# Patient Record
Sex: Male | Born: 1975 | Race: White | Hispanic: No | Marital: Single | Smoking: Never smoker
Health system: Southern US, Community
[De-identification: ages and names within clinical notes are randomized; demographics above are authoritative.]

## PROBLEM LIST (undated history)

## (undated) DIAGNOSIS — I10 Essential (primary) hypertension: Secondary | ICD-10-CM

## (undated) DIAGNOSIS — E7800 Pure hypercholesterolemia, unspecified: Secondary | ICD-10-CM

## (undated) DIAGNOSIS — F840 Autistic disorder: Secondary | ICD-10-CM

## (undated) DIAGNOSIS — G4730 Sleep apnea, unspecified: Secondary | ICD-10-CM

## (undated) HISTORY — PX: TONSILLECTOMY: SUR1361

---
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
## 2020-01-04 ENCOUNTER — Observation Stay (HOSPITAL_BASED_OUTPATIENT_CLINIC_OR_DEPARTMENT_OTHER)
Admission: EM | Admit: 2020-01-04 | Discharge: 2020-01-06 | Disposition: A | Discharge status: Home / Self Care | Payer: 59 | Attending: Family Medicine | Admitting: Family Medicine

## 2020-01-04 ENCOUNTER — Emergency Department (HOSPITAL_COMMUNITY): Payer: 59

## 2020-01-04 ENCOUNTER — Encounter (HOSPITAL_BASED_OUTPATIENT_CLINIC_OR_DEPARTMENT_OTHER): Payer: Self-pay | Admitting: Emergency Medicine

## 2020-01-04 ENCOUNTER — Other Ambulatory Visit: Payer: Self-pay

## 2020-01-04 ENCOUNTER — Emergency Department (HOSPITAL_BASED_OUTPATIENT_CLINIC_OR_DEPARTMENT_OTHER): Payer: 59

## 2020-01-04 ENCOUNTER — Inpatient Hospital Stay (HOSPITAL_COMMUNITY): Payer: 59

## 2020-01-04 DIAGNOSIS — G4733 Obstructive sleep apnea (adult) (pediatric): Secondary | ICD-10-CM | POA: Diagnosis not present

## 2020-01-04 DIAGNOSIS — I161 Hypertensive emergency: Secondary | ICD-10-CM | POA: Diagnosis not present

## 2020-01-04 DIAGNOSIS — Z6835 Body mass index (BMI) 35.0-35.9, adult: Secondary | ICD-10-CM | POA: Diagnosis not present

## 2020-01-04 DIAGNOSIS — Z20822 Contact with and (suspected) exposure to covid-19: Secondary | ICD-10-CM | POA: Insufficient documentation

## 2020-01-04 DIAGNOSIS — H8120 Vestibular neuronitis, unspecified ear: Secondary | ICD-10-CM | POA: Insufficient documentation

## 2020-01-04 DIAGNOSIS — H5509 Other forms of nystagmus: Secondary | ICD-10-CM | POA: Insufficient documentation

## 2020-01-04 DIAGNOSIS — I639 Cerebral infarction, unspecified: Secondary | ICD-10-CM

## 2020-01-04 DIAGNOSIS — R42 Dizziness and giddiness: Secondary | ICD-10-CM | POA: Diagnosis present

## 2020-01-04 DIAGNOSIS — E7800 Pure hypercholesterolemia, unspecified: Secondary | ICD-10-CM | POA: Diagnosis not present

## 2020-01-04 DIAGNOSIS — D329 Benign neoplasm of meninges, unspecified: Secondary | ICD-10-CM | POA: Diagnosis not present

## 2020-01-04 DIAGNOSIS — H8309 Labyrinthitis, unspecified ear: Secondary | ICD-10-CM | POA: Insufficient documentation

## 2020-01-04 DIAGNOSIS — I10 Essential (primary) hypertension: Secondary | ICD-10-CM | POA: Diagnosis not present

## 2020-01-04 DIAGNOSIS — E785 Hyperlipidemia, unspecified: Secondary | ICD-10-CM | POA: Insufficient documentation

## 2020-01-04 DIAGNOSIS — F840 Autistic disorder: Secondary | ICD-10-CM | POA: Diagnosis not present

## 2020-01-04 DIAGNOSIS — I119 Hypertensive heart disease without heart failure: Secondary | ICD-10-CM | POA: Diagnosis not present

## 2020-01-04 DIAGNOSIS — Z9989 Dependence on other enabling machines and devices: Secondary | ICD-10-CM

## 2020-01-04 DIAGNOSIS — D320 Benign neoplasm of cerebral meninges: Secondary | ICD-10-CM | POA: Diagnosis not present

## 2020-01-04 HISTORY — DX: Pure hypercholesterolemia, unspecified: E78.00

## 2020-01-04 HISTORY — DX: Sleep apnea, unspecified: G47.30

## 2020-01-04 HISTORY — DX: Autistic disorder: F84.0

## 2020-01-04 LAB — CBC
HCT: 50.2 % (ref 39.0–52.0)
Hemoglobin: 17.9 g/dL — ABNORMAL HIGH (ref 13.0–17.0)
MCH: 30.4 pg (ref 26.0–34.0)
MCHC: 35.7 g/dL (ref 30.0–36.0)
MCV: 85.2 fL (ref 80.0–100.0)
Platelets: 232 10*3/uL (ref 150–400)
RBC: 5.89 MIL/uL — ABNORMAL HIGH (ref 4.22–5.81)
RDW: 12.4 % (ref 11.5–15.5)
WBC: 6.1 10*3/uL (ref 4.0–10.5)
nRBC: 0 % (ref 0.0–0.2)

## 2020-01-04 LAB — APTT: aPTT: 35 seconds (ref 24–36)

## 2020-01-04 LAB — COMPREHENSIVE METABOLIC PANEL
ALT: 46 U/L — ABNORMAL HIGH (ref 0–44)
AST: 32 U/L (ref 15–41)
Albumin: 4.4 g/dL (ref 3.5–5.0)
Alkaline Phosphatase: 67 U/L (ref 38–126)
Anion gap: 12 (ref 5–15)
BUN: 17 mg/dL (ref 6–20)
CO2: 25 mmol/L (ref 22–32)
Calcium: 9.5 mg/dL (ref 8.9–10.3)
Chloride: 102 mmol/L (ref 98–111)
Creatinine, Ser: 1.13 mg/dL (ref 0.61–1.24)
GFR calc Af Amer: >60 mL/min (ref >60)
GFR calc non Af Amer: >60 mL/min (ref >60)
Glucose, Bld: 146 mg/dL — ABNORMAL HIGH (ref 70–99)
Potassium: 4 mmol/L (ref 3.5–5.1)
Sodium: 139 mmol/L (ref 135–145)
Total Bilirubin: 1.2 mg/dL (ref 0.3–1.2)
Total Protein: 7.4 g/dL (ref 6.5–8.1)

## 2020-01-04 LAB — RAPID URINE DRUG SCREEN, HOSP PERFORMED
Amphetamines: NOT DETECTED
Barbiturates: NOT DETECTED
Benzodiazepines: NOT DETECTED
Cocaine: NOT DETECTED
Opiates: NOT DETECTED
Tetrahydrocannabinol: NOT DETECTED

## 2020-01-04 LAB — DIFFERENTIAL
Abs Immature Granulocytes: 0.02 10*3/uL (ref 0.00–0.07)
Basophils Absolute: 0 10*3/uL (ref 0.0–0.1)
Basophils Relative: 1 %
Eosinophils Absolute: 0.1 10*3/uL (ref 0.0–0.5)
Eosinophils Relative: 2 %
Immature Granulocytes: 0 %
Lymphocytes Relative: 32 %
Lymphs Abs: 1.9 10*3/uL (ref 0.7–4.0)
Monocytes Absolute: 0.3 10*3/uL (ref 0.1–1.0)
Monocytes Relative: 6 %
Neutro Abs: 3.6 10*3/uL (ref 1.7–7.7)
Neutrophils Relative %: 59 %

## 2020-01-04 LAB — URINALYSIS, ROUTINE W REFLEX MICROSCOPIC
Bilirubin Urine: NEGATIVE
Glucose, UA: NEGATIVE mg/dL
Hgb urine dipstick: NEGATIVE
Ketones, ur: NEGATIVE mg/dL
Leukocytes,Ua: NEGATIVE
Nitrite: NEGATIVE
Protein, ur: NEGATIVE mg/dL
Specific Gravity, Urine: 1.015 (ref 1.005–1.030)
pH: 7 (ref 5.0–8.0)

## 2020-01-04 LAB — ETHANOL: Alcohol, Ethyl (B): <10 mg/dL (ref <10)

## 2020-01-04 LAB — SARS CORONAVIRUS 2 BY RT PCR (HOSPITAL ORDER, PERFORMED IN ~~LOC~~ HOSPITAL LAB): SARS Coronavirus 2: NEGATIVE

## 2020-01-04 LAB — PROTIME-INR
INR: 1.1 (ref 0.8–1.2)
Prothrombin Time: 14.1 seconds (ref 11.4–15.2)

## 2020-01-04 LAB — GLUCOSE, CAPILLARY: Glucose-Capillary: 116 mg/dL — ABNORMAL HIGH (ref 70–99)

## 2020-01-04 LAB — CBG MONITORING, ED: Glucose-Capillary: 131 mg/dL — ABNORMAL HIGH (ref 70–99)

## 2020-01-04 MED ORDER — PROCHLORPERAZINE EDISYLATE 10 MG/2ML IJ SOLN
10.0000 mg | Freq: Once | INTRAMUSCULAR | Status: AC
  Administered 2020-01-04: 10 mg via INTRAVENOUS
  Filled 2020-01-04: qty 2

## 2020-01-04 MED ORDER — DIPHENHYDRAMINE HCL 50 MG/ML IJ SOLN
25.0000 mg | Freq: Once | INTRAMUSCULAR | Status: AC
  Administered 2020-01-04: 25 mg via INTRAVENOUS
  Filled 2020-01-04: qty 1

## 2020-01-04 MED ORDER — ASPIRIN 325 MG PO TABS
325.0000 mg | ORAL_TABLET | Freq: Every day | ORAL | Status: DC
  Administered 2020-01-04 – 2020-01-05 (×2): 325 mg via ORAL
  Filled 2020-01-04 (×2): qty 1

## 2020-01-04 MED ORDER — CLOPIDOGREL BISULFATE 75 MG PO TABS
75.0000 mg | ORAL_TABLET | Freq: Every day | ORAL | Status: DC
  Administered 2020-01-04 – 2020-01-06 (×3): 75 mg via ORAL
  Filled 2020-01-04 (×3): qty 1

## 2020-01-04 MED ORDER — ACETAMINOPHEN 325 MG PO TABS
650.0000 mg | ORAL_TABLET | ORAL | Status: DC | PRN
  Administered 2020-01-05 – 2020-01-06 (×4): 650 mg via ORAL
  Filled 2020-01-04 (×4): qty 2

## 2020-01-04 MED ORDER — IOHEXOL 350 MG/ML SOLN
100.0000 mL | Freq: Once | INTRAVENOUS | Status: AC | PRN
  Administered 2020-01-04: 100 mL via INTRAVENOUS

## 2020-01-04 MED ORDER — ACETAMINOPHEN 160 MG/5ML PO SOLN: 650.0000 mg | ORAL | Status: DC | PRN

## 2020-01-04 MED ORDER — NITROGLYCERIN IN D5W 200-5 MCG/ML-% IV SOLN
0.0000 ug/min | INTRAVENOUS | Status: DC
  Administered 2020-01-04: 20 ug/min via INTRAVENOUS
  Filled 2020-01-04: qty 250

## 2020-01-04 MED ORDER — CHLORHEXIDINE GLUCONATE CLOTH 2 % EX PADS: 6.0000 | MEDICATED_PAD | Freq: Every day | CUTANEOUS | Status: DC

## 2020-01-04 MED ORDER — ENOXAPARIN SODIUM 40 MG/0.4ML ~~LOC~~ SOLN
40.0000 mg | SUBCUTANEOUS | Status: DC
  Administered 2020-01-04 – 2020-01-05 (×2): 40 mg via SUBCUTANEOUS
  Filled 2020-01-04 (×2): qty 0.4

## 2020-01-04 MED ORDER — LABETALOL HCL 5 MG/ML IV SOLN
20.0000 mg | Freq: Once | INTRAVENOUS | Status: AC
  Administered 2020-01-04: 20 mg via INTRAVENOUS
  Filled 2020-01-04: qty 4

## 2020-01-04 MED ORDER — ACETAMINOPHEN 650 MG RE SUPP: 650.0000 mg | RECTAL | Status: DC | PRN

## 2020-01-04 MED ORDER — NICARDIPINE HCL IN NACL 20-0.86 MG/200ML-% IV SOLN
3.0000 mg/h | INTRAVENOUS | Status: DC
  Filled 2020-01-04: qty 200

## 2020-01-04 MED ORDER — LABETALOL HCL 5 MG/ML IV SOLN
INTRAVENOUS | Status: AC
  Filled 2020-01-04: qty 4

## 2020-01-04 MED ORDER — SODIUM CHLORIDE 0.9 % IV SOLN
0.5000 mg/min | INTRAVENOUS | Status: DC
  Administered 2020-01-04: 0.5 mg/min via INTRAVENOUS
  Filled 2020-01-04 (×2): qty 100

## 2020-01-04 MED ORDER — STROKE: EARLY STAGES OF RECOVERY BOOK
Freq: Once | Status: AC
  Filled 2020-01-04: qty 1

## 2020-01-04 MED ORDER — LABETALOL HCL 5 MG/ML IV SOLN
10.0000 mg | Freq: Once | INTRAVENOUS | Status: AC
  Administered 2020-01-04: 10 mg via INTRAVENOUS
  Filled 2020-01-04: qty 4

## 2020-01-04 NOTE — ED Notes (Signed)
Patient transported to MRI 

## 2020-01-04 NOTE — Discharge Instructions (Signed)
Go now to the [facility] ED.

## 2020-01-04 NOTE — ED Notes (Signed)
ED Provider at bedside. 

## 2020-01-04 NOTE — Progress Notes (Signed)
[place] Progress Note Patient Name: Anthony Haas DOB: 02-22-1976 MRN: [id number]   Date of Service  01/04/2020  HPI/Events of Note  FP resident Dr. Zettie Cooley called requesting permission to admit patient with hypertensive urgency to the ICU on the [facility] service due to the potential need for an infusion to control patient's blood pressure, Pt's elevated blood pressure appears to be principally due to non-compliance with his anti-hypertensive medications, a small meningioma on MRI of his brain today was an incidental finding.  eICU Interventions  Dr. Maudie Mercury informed that it was okay to admit patient to the ICU under [place] service.        Kerry Kass Tipton Ballow 01/04/2020, 9:01 PM

## 2020-01-04 NOTE — ED Notes (Signed)
Patient transported to CT with nurse on monitor ?

## 2020-01-04 NOTE — ED Provider Notes (Signed)
[facility] EMERGENCY DEPARTMENT Provider Note   CSN: [id number] Arrival date & time: 01/04/20  [id number]     History Chief Complaint  Patient presents with   Dizziness    Anthony Haas is a 44 y.o. male.  44 yo M with a chief complaints of dizziness.  Patient states he feels like the world was moving slowly to the right.  This happened acutely about 4 hours ago.  No headache no neck pain.  No head trauma.  He was at work and was looking at his phone.  Works the night shift.  At 1 point felt very sweaty.  He got up to walk to the bathroom and he ended up bumping against the wall as he felt like everything was turning to the right.  He ended up calling some coworkers who called the local EMS.  At that point the patient symptoms had resolved and he was able to get up and walk to his car.  Just before he got to his car his symptoms had reoccurred.  Continue to feel like things are moving to the right.  He sat in his car for about 20 minutes or so and then realized things were not improving and so called his father to take him here.  Since then his symptoms have again significantly improved.  He feels that things are very slowly moving to the right but are otherwise better.  He has chronic right-sided tinnitus which is unchanged.  Chronic sinus congestion.  Denies difficulty with one-sided numbness or weakness denies difficulty with speech or swallowing.  He does have a history of hypertension and is not on medication for it.  The history is provided by the patient and a parent.  Dizziness Quality:  Imbalance Severity:  Moderate Onset quality:  Gradual Duration:  4 hours Timing:  Constant Progression:  Unchanged Chronicity:  New Relieved by:  Nothing Worsened by:  Nothing Ineffective treatments:  None tried Associated symptoms: no chest pain, no diarrhea, no headaches, no palpitations, no shortness of breath and no vomiting        Past Medical History:  Diagnosis Date   Autism      High cholesterol    Hypertension    Sleep apnea     There are no problems to display for this patient.   Past Surgical History:  Procedure Laterality Date   TONSILLECTOMY         No family history on file.  Social History   Tobacco Use   Smoking status: Not on file  Substance Use Topics   Alcohol use: Not Currently   Drug use: Not on file    Home Medications Prior to Admission medications   Not on File    Allergies    Patient has no known allergies.  Review of Systems   Review of Systems  Constitutional: Negative for chills and fever.  HENT: Negative for congestion and facial swelling.   Eyes: Negative for discharge and visual disturbance.  Respiratory: Negative for shortness of breath.   Cardiovascular: Negative for chest pain and palpitations.  Gastrointestinal: Negative for abdominal pain, diarrhea and vomiting.  Musculoskeletal: Negative for arthralgias and myalgias.  Skin: Negative for color change and rash.  Neurological: Positive for dizziness. Negative for tremors, syncope and headaches.  Psychiatric/Behavioral: Negative for confusion and dysphoric mood.    Physical Exam Updated Vital Signs BP (!) 216/134    Pulse 84    Temp 98 F (36.7 C) (Oral)    Resp  20    Ht 5\' 6"  (1.676 m)    Wt 110.2 kg    SpO2 99%    BMI 39.21 kg/m   Physical Exam Vitals and nursing note reviewed.  Constitutional:      Appearance: He is well-developed.  HENT:     Head: Normocephalic and atraumatic.  Eyes:     Pupils: Pupils are equal, round, and reactive to light.     Comments: Leftward fastgoing nystagmus non fatigable.   Neck:     Vascular: No JVD.  Cardiovascular:     Rate and Rhythm: Normal rate and regular rhythm.     Heart sounds: No murmur. No friction rub. No gallop.   Pulmonary:     Effort: No respiratory distress.     Breath sounds: No wheezing.  Abdominal:     General: There is no distension.     Tenderness: There is no guarding or rebound.   Musculoskeletal:        General: Normal range of motion.     Cervical back: Normal range of motion and neck supple.  Skin:    Coloration: Skin is not pale.     Findings: No rash.  Neurological:     Mental Status: He is alert and oriented to person, place, and time.     GCS: GCS eye subscore is 4. GCS verbal subscore is 5. GCS motor subscore is 6.     Cranial Nerves: Cranial nerves are intact.     Sensory: Sensation is intact.     Motor: Motor function is intact.     Coordination: Coordination is intact.     Comments: Patient is able to ambulate but awkwardly bumps against the door before he returns back to the bed.  Otherwise benign exam.  Negative Dix-Hallpike bilaterally  Psychiatric:        Behavior: Behavior normal.     ED Results / Procedures / Treatments   Labs (all labs ordered are listed, but only abnormal results are displayed) Labs Reviewed  CBC - Abnormal; Notable for the following components:      Result Value   RBC 5.89 (*)    Hemoglobin 17.9 (*)    All other components within normal limits  COMPREHENSIVE METABOLIC PANEL - Abnormal; Notable for the following components:   Glucose, Bld 146 (*)    ALT 46 (*)    All other components within normal limits  CBG MONITORING, ED - Abnormal; Notable for the following components:   Glucose-Capillary 131 (*)    All other components within normal limits  PROTIME-INR  APTT  DIFFERENTIAL  ETHANOL  RAPID URINE DRUG SCREEN, HOSP PERFORMED  URINALYSIS, ROUTINE W REFLEX MICROSCOPIC    EKG EKG Interpretation  Date/Time:  Tuesday Jan 04 2020 09:49:45 EDT Ventricular Rate:  80 PR Interval:    QRS Duration: 96 QT Interval:  369 QTC Calculation: 426 R Axis:   59 Text Interpretation: Sinus rhythm LAE, consider biatrial enlargement Repol abnrm suggests ischemia, inferior leads No old tracing to compare Confirmed by Deno Etienne [phone]) on 01/04/2020 10:04:42 AM   Radiology CT Code Stroke CTA Head W/WO contrast  Result  Date: 01/04/2020 CLINICAL DATA:  Dizziness and nausea EXAM: CT ANGIOGRAPHY HEAD AND NECK TECHNIQUE: Multidetector CT imaging of the head and neck was performed using the standard protocol during bolus administration of intravenous contrast. Multiplanar CT image reconstructions and MIPs were obtained to evaluate the vascular anatomy. Carotid stenosis measurements (when applicable) are obtained utilizing NASCET criteria, using the  distal internal carotid diameter as the denominator. CONTRAST:  119mL OMNIPAQUE IOHEXOL 350 MG/ML SOLN COMPARISON:  None. FINDINGS: CT HEAD FINDINGS Brain: Ventricle size normal. Negative for acute infarct, hemorrhage, mass. Vascular: Vessels of the base of brain are diffusely increased density however there is no asymmetry evidence of acute abnormality. This finding may be due to elevated hemoglobin. Skull: Negative. 12 mm dermal cyst in the left occipital region most likely a Pilar cyst. Sinuses: Mild mucosal edema right ethmoid sinus otherwise clear. Orbits: Negative Review of the MIP images confirms the above findings CTA NECK FINDINGS Aortic arch: 4 vessel arch. Left vertebral origin from the arch. Imaged portion shows no evidence of aneurysm or dissection. No significant stenosis of the major arch vessel origins. Right carotid system: Normal right carotid without stenosis or dissection Left carotid system: Normal left carotid without stenosis or dissection Vertebral arteries: Both vertebral arteries widely patent. Left vertebral artery origin from the arch. Skeleton: No acute skeletal abnormality.  Dental caries. Other neck: Negative for mass or adenopathy in the neck. Upper chest: Lung apices clear bilaterally. Review of the MIP images confirms the above findings CTA HEAD FINDINGS Anterior circulation: Internal carotid artery widely patent bilaterally without stenosis. Anterior and middle cerebral arteries widely patent bilaterally. Posterior circulation: Both vertebral arteries  patent to the basilar. Right vertebral dominant. PICA patent bilaterally. Basilar tortuous and widely patent. Superior cerebellar and posterior cerebral arteries patent bilaterally. Fetal origin right posterior cerebral artery. Venous sinuses: Normal venous enhancement Anatomic variants: None Review of the MIP images confirms the above findings IMPRESSION: 1. No acute intracranial abnormality. 2. Negative for intracranial large vessel occlusion 3. No significant carotid or vertebral artery stenosis in the neck 4. No intracranial stenosis. 5. These results were called by telephone at the time of interpretation on 01/04/2020 at 10:34 am to provider Khyson Sebesta , who verbally acknowledged these results. Electronically Signed   By: Franchot Gallo M.D.   On: 01/04/2020 10:34   CT HEAD WO CONTRAST  Result Date: 01/04/2020 CLINICAL DATA:  Dizziness and nausea EXAM: CT ANGIOGRAPHY HEAD AND NECK TECHNIQUE: Multidetector CT imaging of the head and neck was performed using the standard protocol during bolus administration of intravenous contrast. Multiplanar CT image reconstructions and MIPs were obtained to evaluate the vascular anatomy. Carotid stenosis measurements (when applicable) are obtained utilizing NASCET criteria, using the distal internal carotid diameter as the denominator. CONTRAST:  138mL OMNIPAQUE IOHEXOL 350 MG/ML SOLN COMPARISON:  None. FINDINGS: CT HEAD FINDINGS Brain: Ventricle size normal. Negative for acute infarct, hemorrhage, mass. Vascular: Vessels of the base of brain are diffusely increased density however there is no asymmetry evidence of acute abnormality. This finding may be due to elevated hemoglobin. Skull: Negative. 12 mm dermal cyst in the left occipital region most likely a Pilar cyst. Sinuses: Mild mucosal edema right ethmoid sinus otherwise clear. Orbits: Negative Review of the MIP images confirms the above findings CTA NECK FINDINGS Aortic arch: 4 vessel arch. Left vertebral origin from  the arch. Imaged portion shows no evidence of aneurysm or dissection. No significant stenosis of the major arch vessel origins. Right carotid system: Normal right carotid without stenosis or dissection Left carotid system: Normal left carotid without stenosis or dissection Vertebral arteries: Both vertebral arteries widely patent. Left vertebral artery origin from the arch. Skeleton: No acute skeletal abnormality.  Dental caries. Other neck: Negative for mass or adenopathy in the neck. Upper chest: Lung apices clear bilaterally. Review of the MIP images confirms the above  findings CTA HEAD FINDINGS Anterior circulation: Internal carotid artery widely patent bilaterally without stenosis. Anterior and middle cerebral arteries widely patent bilaterally. Posterior circulation: Both vertebral arteries patent to the basilar. Right vertebral dominant. PICA patent bilaterally. Basilar tortuous and widely patent. Superior cerebellar and posterior cerebral arteries patent bilaterally. Fetal origin right posterior cerebral artery. Venous sinuses: Normal venous enhancement Anatomic variants: None Review of the MIP images confirms the above findings IMPRESSION: 1. No acute intracranial abnormality. 2. Negative for intracranial large vessel occlusion 3. No significant carotid or vertebral artery stenosis in the neck 4. No intracranial stenosis. 5. These results were called by telephone at the time of interpretation on 01/04/2020 at 10:34 am to provider Sharena Dibenedetto , who verbally acknowledged these results. Electronically Signed   By: Franchot Gallo M.D.   On: 01/04/2020 10:34   CT Code Stroke CTA Neck W/WO contrast  Result Date: 01/04/2020 CLINICAL DATA:  Dizziness and nausea EXAM: CT ANGIOGRAPHY HEAD AND NECK TECHNIQUE: Multidetector CT imaging of the head and neck was performed using the standard protocol during bolus administration of intravenous contrast. Multiplanar CT image reconstructions and MIPs were obtained to  evaluate the vascular anatomy. Carotid stenosis measurements (when applicable) are obtained utilizing NASCET criteria, using the distal internal carotid diameter as the denominator. CONTRAST:  118mL OMNIPAQUE IOHEXOL 350 MG/ML SOLN COMPARISON:  None. FINDINGS: CT HEAD FINDINGS Brain: Ventricle size normal. Negative for acute infarct, hemorrhage, mass. Vascular: Vessels of the base of brain are diffusely increased density however there is no asymmetry evidence of acute abnormality. This finding may be due to elevated hemoglobin. Skull: Negative. 12 mm dermal cyst in the left occipital region most likely a Pilar cyst. Sinuses: Mild mucosal edema right ethmoid sinus otherwise clear. Orbits: Negative Review of the MIP images confirms the above findings CTA NECK FINDINGS Aortic arch: 4 vessel arch. Left vertebral origin from the arch. Imaged portion shows no evidence of aneurysm or dissection. No significant stenosis of the major arch vessel origins. Right carotid system: Normal right carotid without stenosis or dissection Left carotid system: Normal left carotid without stenosis or dissection Vertebral arteries: Both vertebral arteries widely patent. Left vertebral artery origin from the arch. Skeleton: No acute skeletal abnormality.  Dental caries. Other neck: Negative for mass or adenopathy in the neck. Upper chest: Lung apices clear bilaterally. Review of the MIP images confirms the above findings CTA HEAD FINDINGS Anterior circulation: Internal carotid artery widely patent bilaterally without stenosis. Anterior and middle cerebral arteries widely patent bilaterally. Posterior circulation: Both vertebral arteries patent to the basilar. Right vertebral dominant. PICA patent bilaterally. Basilar tortuous and widely patent. Superior cerebellar and posterior cerebral arteries patent bilaterally. Fetal origin right posterior cerebral artery. Venous sinuses: Normal venous enhancement Anatomic variants: None Review of the  MIP images confirms the above findings IMPRESSION: 1. No acute intracranial abnormality. 2. Negative for intracranial large vessel occlusion 3. No significant carotid or vertebral artery stenosis in the neck 4. No intracranial stenosis. 5. These results were called by telephone at the time of interpretation on 01/04/2020 at 10:34 am to provider Jaelene Garciagarcia , who verbally acknowledged these results. Electronically Signed   By: Franchot Gallo M.D.   On: 01/04/2020 10:34    Procedures Procedures (including critical care time)  Medications Ordered in ED Medications  labetalol (NORMODYNE) 5 MG/ML injection (has no administration in time range)  prochlorperazine (COMPAZINE) injection 10 mg (10 mg Intravenous Given 01/04/20 1027)  diphenhydrAMINE (BENADRYL) injection 25 mg (25 mg Intravenous Given  01/04/20 1027)  iohexol (OMNIPAQUE) 350 MG/ML injection 100 mL (100 mLs Intravenous Contrast Given 01/04/20 1006)    ED Course  I have reviewed the triage vital signs and the nursing notes.  Pertinent labs & imaging results that were available during my care of the patient were reviewed by me and considered in my medical decision making (see chart for details).    MDM Rules/Calculators/A&P                      44 yo M with a chief complaints of a feeling of unsteadiness.  States he feels like the room is slowly turning to the right.  This is been off and on since 6 AM.  On exam he has a persistent leftward fast going nystagmus.  Dix-Hallpike was negative.  He is also significantly hypertensive with a blood pressure of 240/150.  I discussed the case with Dr. Lorraine Lax, neurology.  He recommended activating is a code stroke as the patient is in the window.  Seen by the teleneurologist here.  Thought more likely to be peripheral than central did not feel that TPA risk would outweigh the benefit.  Did recommend obtaining an MRI.  I discussed this with Dr. Lorraine Lax, he agreed.  Discussed with Dr. Wilson Singer accepts the patient  in transfer.  The patients results and plan were reviewed and discussed.   Any x-rays performed were independently reviewed by myself.   Differential diagnosis were considered with the presenting HPI.  Medications  labetalol (NORMODYNE) 5 MG/ML injection (has no administration in time range)  prochlorperazine (COMPAZINE) injection 10 mg (10 mg Intravenous Given 01/04/20 1027)  diphenhydrAMINE (BENADRYL) injection 25 mg (25 mg Intravenous Given 01/04/20 1027)  iohexol (OMNIPAQUE) 350 MG/ML injection 100 mL (100 mLs Intravenous Contrast Given 01/04/20 1006)    Vitals:   01/04/20 0935 01/04/20 0937 01/04/20 1016  BP:  (!) 238/148 (!) 216/134  Pulse:  77 84  Resp:  18 20  Temp:  98 F (36.7 C)   TempSrc:  Oral   SpO2:  100% 99%  Weight: 110.2 kg    Height: 5\' 6"  (1.676 m)      Final diagnoses:  Acute onset of severe vertigo     Final Clinical Impression(s) / ED Diagnoses Final diagnoses:  Acute onset of severe vertigo    Rx / DC Orders ED Discharge Orders    None       Deno Etienne, DO 01/04/20 1040

## 2020-01-04 NOTE — ED Notes (Signed)
Labetalol brought to bedside per teleneuro instructions

## 2020-01-04 NOTE — ED Triage Notes (Signed)
Dizziness started at 6am while at work (3rd shift) with nausea. He states the dizziness comes and goes and the nausea persists.

## 2020-01-04 NOTE — Progress Notes (Signed)
[place] Progress Note Patient Name: Anthony Haas DOB: 1976/01/27 MRN: [id number]   Date of Service  01/04/2020  HPI/Events of Note  Bedside RN requesting a diet for patient,  He passed a speech evaluation in ED.  eICU Interventions  Heart healthy diet ordered.        Kerry Kass Ogan 01/04/2020, 10:41 PM

## 2020-01-04 NOTE — H&P (Addendum)
[facility] Admission History and Physical Service Pager: [phone]  Patient name: Anthony Haas Medical record number: [id number] Date of birth: 1976/03/11 Age: 44 y.o. Gender: male  Primary Care Provider: Patient, No Pcp Per Consultants: Neurology  Code Status: Full  Preferred Emergency Contact: Adian, [place] [phone]  Chief Complaint:  Nausea and Dizziness   Assessment and Plan: Anthony Haas is a 44 y.o. male presenting with dizziness and nausea . PMH is significant for HTN, OSA and autism spectrum disorder.   Concern for CVA   Patient transferred from [facility] after teleneurologist recommended CVA work-up.  Patient presented with acute onset dizziness and nausea today while at work.  It briefly resolved however returned when he was about to drive home.  Dad brought patient to [place].  MRI was significant for meningioma (18 x 13 x 16 mm) to the right frontal lobe.  However it was felt that the meningioma not likely the cause of patient's symptoms.  Premature (for age) chronic microvascular ischemia was noted. CTA head and neck was without significant carotid, vertebral artery stenosis or large vessel occlusion. Patient's neurological exam significant for horizontal nystagmus.  UDS negative.  UA was unremarkable.  CBC and CMP are grossly unremarkable.  EtOH was negative.  Await additional neurological recommendations.  -Admit to ICU with cardiac monitoring, attending Dr. McDiarmid -Nursing: Vitals per unit routine, out of bed with assistance only, neuro checks q2 -Consult neuro stroke team, appreciate recs -A1c, Lipid panel pending -Imaging: Carotid Dopplers, Echo ordered -Start ASA 325 + clopidigrel 75mg  now  -Consult SLP, PT, OT- evaluate and treat  -NPO until passes speech eval -Permissive HTN up to 220/120 x 24 hours -Labetolol gtt  -Consider Neurosurgery consult for meningioma     Hypertensive emergency  hx of  uncontrolled HTN  Patient with SBP 240+ / 150+ while in the ED.  Patient denied headache, chest pain or shortness of breath. Does report some blurry vision. Creatine was within normal limits. Per neurologist Dr. Leonel Ramsay, little concern for increased intracranial pressure. Patient reports he previously was on antihypertensive medications however his blood pressure normalized and he stopped taking the meds.   - s/p IV Labetolol x2 in ED  - Start Labetolol gtt with permissive HTN (220 /120)  - Monitor BP  - ICU status only for Labetalol gtt   Autism spectrum disorder Diagnosed in Jan 2016 by [facility] in [place]. Per patient, he has high functioning ASD with  difficulties in social situations. During his evaluation it was noted that, Elex demonstrated clinically significant impairments in executive functioning in nearly all areas of executive function including his ability to regulate his behavior (inhibition of behavior, shifting attention, emotional control, and self-monitoring) and in metacognitive areas (i.e. initiation of activities, working memory, planning/organizing, and task monitoring).   OSA  Patient on CPAP at bedtime.   - CPAP at bedtime   FEN/GI: NPO until passes bedside swallow, replete electrolytes as needed  Prophylaxis: Lovenox   Disposition: Admit to progressive   History of Present Illness:  Anthony Haas is a 44 y.o. male transferred from [place] for CVA work-up.   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 States around 6:10 AM was watching TV on his phone turned his head straight to look forward and things rotated slowly right to left, then a few minutes later spinning fast and "sweating pretty bad for no reason."  He told his boss he wasn't  feeling goood.  He walked to bathroom to splash water on his face and made his way back to the chair. Reports the room was still spinning fast and then slowed down. He called the university  police and they recommended EMTs to check him out. Once EMT arrived he states that he felt fine and dizziness and had sweating resolved. Walking to car and things started rotating again and got nauseous. He then called his Dad who took him to [place] for evaluation.    Imaging revealed patient had a meningioma but this was not likely the cause of the patients symptoms.  Teleneuro evaluated the patient and he was transferred to [facility] ED for admission for CVA work up.     Review Of Systems: Per HPI with the following additions:   Review of Systems  Constitutional: Positive for diaphoresis. Negative for chills, fever and weight loss.  HENT: Positive for congestion (chronic) and tinnitus (R sided (past few weeks)). Negative for ear pain and sore throat.   Eyes: Positive for blurred vision and redness. Negative for double vision.  Respiratory: Negative for cough and shortness of breath.   Cardiovascular: Negative for chest pain and palpitations.  Gastrointestinal: Positive for nausea and vomiting. Negative for abdominal pain and diarrhea.  Genitourinary: Negative for dysuria.  Musculoskeletal: Negative for falls, joint pain and neck pain.  Skin: Negative for rash.  Neurological: Positive for dizziness. Negative for tingling, focal weakness, weakness and headaches.  Psychiatric/Behavioral: Negative for memory loss.    Past Medical History: Past Medical History:  Diagnosis Date  . Autism   . High cholesterol   . Hypertension   . Sleep apnea     Past Surgical History: Past Surgical History:  Procedure Laterality Date  . TONSILLECTOMY      Social History: Social History   Tobacco Use  . Smoking status: Not on file  Substance Use Topics  . Alcohol use: Not Currently  . Drug use: Not on file   Additional social history: Has a 24 year old daughter and is divorced.  Please also refer to relevant sections of EMR.  Family History: No family history on file. Diabetes in  maternal grandmother.   Allergies and Medications: No Known Allergies No current facility-administered medications on file prior to encounter.   No current outpatient medications on file prior to encounter.    Objective: BP (!) 216/155   Pulse 79   Temp 98 F (36.7 C) (Oral)   Resp 16   Ht 5\' 5"  (1.651 m)   Wt 108 kg   SpO2 95%   BMI 39.61 kg/m   Exam: General: alert, speaking in full sentences without pause, in no acute distress  Eyes: horizontal Nystagmus present at rest, pupils equal and reactive to light and accomodation, conjunctivitis present  ENTM: mucus membranes moist, nares patent, oropharynx without lesions or erythema  Neck: normal range of motion, no palpable cervical lymphadenopathy  Cardiovascular: regular rate and rhythm, no mumur appreciated, distal pulses intact  Respiratory: lungs clear to ascultation, no increased work of breathing  Gastrointestinal: soft, obese abdomen, non-distended, non-tender, no rebound or guarding MSK: normal ROM, non-tender, no lower extremity edema  Derm: no rash, normal skin turgor  Neuro: alert, oriented x4, gross sensation (sharp and dull) intact to all extremities, strength 5/5 bilateral upper and lower extremities, normal finger to nose, normal heel-to-shin, normal Romberg, normal speech, recent and remote memory intact, CN 2-12 grossly intact, gait briefly assessed an was unremarkable. UE/LE reflexes  2+.  Psych: normal affect   Labs and Imaging: CBC BMET  Recent Labs  Lab 01/04/20 1001  WBC 6.1  HGB 17.9*  HCT 50.2  PLT 232   Recent Labs  Lab 01/04/20 1001  NA 139  K 4.0  CL 102  CO2 25  BUN 17  CREATININE 1.13  GLUCOSE 146*  CALCIUM 9.5     EKG: NSR HR 80, Repol abnrm suggests ?ischemia, inferior leads   CT Code Stroke CTA Head W/WO contrast  Addendum Date: 01/04/2020   ADDENDUM REPORT: 01/04/2020 14:09 ADDENDUM: Right frontal extra-axial mass lesion most compatible with meningioma. This is best seen on  the MRI from today. This measures approximately 13 x 15 mm. Electronically Signed   By: Franchot Gallo M.D.   On: 01/04/2020 14:09   Result Date: 01/04/2020 CLINICAL DATA:  Dizziness and nausea EXAM: CT ANGIOGRAPHY HEAD AND NECK TECHNIQUE: Multidetector CT imaging of the head and neck was performed using the standard protocol during bolus administration of intravenous contrast. Multiplanar CT image reconstructions and MIPs were obtained to evaluate the vascular anatomy. Carotid stenosis measurements (when applicable) are obtained utilizing NASCET criteria, using the distal internal carotid diameter as the denominator. CONTRAST:  182mL OMNIPAQUE IOHEXOL 350 MG/ML SOLN COMPARISON:  None. FINDINGS: CT HEAD FINDINGS Brain: Ventricle size normal. Negative for acute infarct, hemorrhage, mass. Vascular: Vessels of the base of brain are diffusely increased density however there is no asymmetry evidence of acute abnormality. This finding may be due to elevated hemoglobin. Skull: Negative. 12 mm dermal cyst in the left occipital region most likely a Pilar cyst. Sinuses: Mild mucosal edema right ethmoid sinus otherwise clear. Orbits: Negative Review of the MIP images confirms the above findings CTA NECK FINDINGS Aortic arch: 4 vessel arch. Left vertebral origin from the arch. Imaged portion shows no evidence of aneurysm or dissection. No significant stenosis of the major arch vessel origins. Right carotid system: Normal right carotid without stenosis or dissection Left carotid system: Normal left carotid without stenosis or dissection Vertebral arteries: Both vertebral arteries widely patent. Left vertebral artery origin from the arch. Skeleton: No acute skeletal abnormality.  Dental caries. Other neck: Negative for mass or adenopathy in the neck. Upper chest: Lung apices clear bilaterally. Review of the MIP images confirms the above findings CTA HEAD FINDINGS Anterior circulation: Internal carotid artery widely patent  bilaterally without stenosis. Anterior and middle cerebral arteries widely patent bilaterally. Posterior circulation: Both vertebral arteries patent to the basilar. Right vertebral dominant. PICA patent bilaterally. Basilar tortuous and widely patent. Superior cerebellar and posterior cerebral arteries patent bilaterally. Fetal origin right posterior cerebral artery. Venous sinuses: Normal venous enhancement Anatomic variants: None Review of the MIP images confirms the above findings IMPRESSION: 1. No acute intracranial abnormality. 2. Negative for intracranial large vessel occlusion 3. No significant carotid or vertebral artery stenosis in the neck 4. No intracranial stenosis. 5. These results were called by telephone at the time of interpretation on 01/04/2020 at 10:34 am to provider DAN FLOYD , who verbally acknowledged these results. Electronically Signed: By: Franchot Gallo M.D. On: 01/04/2020 10:34   CT HEAD WO CONTRAST  Addendum Date: 01/04/2020   ADDENDUM REPORT: 01/04/2020 14:09 ADDENDUM: Right frontal extra-axial mass lesion most compatible with meningioma. This is best seen on the MRI from today. This measures approximately 13 x 15 mm. Electronically Signed   By: Franchot Gallo M.D.   On: 01/04/2020 14:09   Result Date: 01/04/2020 CLINICAL DATA:  Dizziness and nausea  EXAM: CT ANGIOGRAPHY HEAD AND NECK TECHNIQUE: Multidetector CT imaging of the head and neck was performed using the standard protocol during bolus administration of intravenous contrast. Multiplanar CT image reconstructions and MIPs were obtained to evaluate the vascular anatomy. Carotid stenosis measurements (when applicable) are obtained utilizing NASCET criteria, using the distal internal carotid diameter as the denominator. CONTRAST:  168mL OMNIPAQUE IOHEXOL 350 MG/ML SOLN COMPARISON:  None. FINDINGS: CT HEAD FINDINGS Brain: Ventricle size normal. Negative for acute infarct, hemorrhage, mass. Vascular: Vessels of the base of brain  are diffusely increased density however there is no asymmetry evidence of acute abnormality. This finding may be due to elevated hemoglobin. Skull: Negative. 12 mm dermal cyst in the left occipital region most likely a Pilar cyst. Sinuses: Mild mucosal edema right ethmoid sinus otherwise clear. Orbits: Negative Review of the MIP images confirms the above findings CTA NECK FINDINGS Aortic arch: 4 vessel arch. Left vertebral origin from the arch. Imaged portion shows no evidence of aneurysm or dissection. No significant stenosis of the major arch vessel origins. Right carotid system: Normal right carotid without stenosis or dissection Left carotid system: Normal left carotid without stenosis or dissection Vertebral arteries: Both vertebral arteries widely patent. Left vertebral artery origin from the arch. Skeleton: No acute skeletal abnormality.  Dental caries. Other neck: Negative for mass or adenopathy in the neck. Upper chest: Lung apices clear bilaterally. Review of the MIP images confirms the above findings CTA HEAD FINDINGS Anterior circulation: Internal carotid artery widely patent bilaterally without stenosis. Anterior and middle cerebral arteries widely patent bilaterally. Posterior circulation: Both vertebral arteries patent to the basilar. Right vertebral dominant. PICA patent bilaterally. Basilar tortuous and widely patent. Superior cerebellar and posterior cerebral arteries patent bilaterally. Fetal origin right posterior cerebral artery. Venous sinuses: Normal venous enhancement Anatomic variants: None Review of the MIP images confirms the above findings IMPRESSION: 1. No acute intracranial abnormality. 2. Negative for intracranial large vessel occlusion 3. No significant carotid or vertebral artery stenosis in the neck 4. No intracranial stenosis. 5. These results were called by telephone at the time of interpretation on 01/04/2020 at 10:34 am to provider DAN FLOYD , who verbally acknowledged these  results. Electronically Signed: By: Franchot Gallo M.D. On: 01/04/2020 10:34   CT Code Stroke CTA Neck W/WO contrast  Addendum Date: 01/04/2020   ADDENDUM REPORT: 01/04/2020 14:09 ADDENDUM: Right frontal extra-axial mass lesion most compatible with meningioma. This is best seen on the MRI from today. This measures approximately 13 x 15 mm. Electronically Signed   By: Franchot Gallo M.D.   On: 01/04/2020 14:09   Result Date: 01/04/2020 CLINICAL DATA:  Dizziness and nausea EXAM: CT ANGIOGRAPHY HEAD AND NECK TECHNIQUE: Multidetector CT imaging of the head and neck was performed using the standard protocol during bolus administration of intravenous contrast. Multiplanar CT image reconstructions and MIPs were obtained to evaluate the vascular anatomy. Carotid stenosis measurements (when applicable) are obtained utilizing NASCET criteria, using the distal internal carotid diameter as the denominator. CONTRAST:  147mL OMNIPAQUE IOHEXOL 350 MG/ML SOLN COMPARISON:  None. FINDINGS: CT HEAD FINDINGS Brain: Ventricle size normal. Negative for acute infarct, hemorrhage, mass. Vascular: Vessels of the base of brain are diffusely increased density however there is no asymmetry evidence of acute abnormality. This finding may be due to elevated hemoglobin. Skull: Negative. 12 mm dermal cyst in the left occipital region most likely a Pilar cyst. Sinuses: Mild mucosal edema right ethmoid sinus otherwise clear. Orbits: Negative Review of the MIP images  confirms the above findings CTA NECK FINDINGS Aortic arch: 4 vessel arch. Left vertebral origin from the arch. Imaged portion shows no evidence of aneurysm or dissection. No significant stenosis of the major arch vessel origins. Right carotid system: Normal right carotid without stenosis or dissection Left carotid system: Normal left carotid without stenosis or dissection Vertebral arteries: Both vertebral arteries widely patent. Left vertebral artery origin from the arch.  Skeleton: No acute skeletal abnormality.  Dental caries. Other neck: Negative for mass or adenopathy in the neck. Upper chest: Lung apices clear bilaterally. Review of the MIP images confirms the above findings CTA HEAD FINDINGS Anterior circulation: Internal carotid artery widely patent bilaterally without stenosis. Anterior and middle cerebral arteries widely patent bilaterally. Posterior circulation: Both vertebral arteries patent to the basilar. Right vertebral dominant. PICA patent bilaterally. Basilar tortuous and widely patent. Superior cerebellar and posterior cerebral arteries patent bilaterally. Fetal origin right posterior cerebral artery. Venous sinuses: Normal venous enhancement Anatomic variants: None Review of the MIP images confirms the above findings IMPRESSION: 1. No acute intracranial abnormality. 2. Negative for intracranial large vessel occlusion 3. No significant carotid or vertebral artery stenosis in the neck 4. No intracranial stenosis. 5. These results were called by telephone at the time of interpretation on 01/04/2020 at 10:34 am to provider DAN FLOYD , who verbally acknowledged these results. Electronically Signed: By: Franchot Gallo M.D. On: 01/04/2020 10:34   MR BRAIN WO CONTRAST  Addendum Date: 01/04/2020   ADDENDUM REPORT: 01/04/2020 14:06 ADDENDUM: These results were called by telephone at the time of interpretation on 01/04/2020 at 2:06 pm to provider Dr. Wilson Singer., who verbally acknowledged these results. Electronically Signed   By: San Morelle M.D.   On: 01/04/2020 14:06   Result Date: 01/04/2020 CLINICAL DATA:  Neuro deficit, acute, stroke suspected. Acute onset of dizziness symptoms have since improved. EXAM: MRI HEAD WITHOUT CONTRAST TECHNIQUE: Multiplanar, multiecho pulse sequences of the brain and surrounding structures were obtained without intravenous contrast. COMPARISON:  CT head and CTA head 01/04/2019. FINDINGS: Brain: A well-defined extra-axial mass lesion  is present anterior to the right frontal lobe measuring 18 x 13 x 16 mm. The straight some mass effect the anterior right frontal lobe with focal subcortical T2 hyperintensity. No significant extra-axial fluid collection is present. No additional intracranial lesions are present. Minimal white matter changes are noted otherwise. Basal ganglia are intact. Focal T2 hyperintensity within the anterior genu of the corpus callosum likely represents a focal 3 mm remote infarct. The internal auditory canals are within normal limits. The brainstem and cerebellum are within normal limits. The ventricles are of normal size. Vascular: Flow is present in the major intracranial arteries. Skull and upper cervical spine: The craniocervical junction is normal. Upper cervical spine is within normal limits. Marrow signal is unremarkable. A 17 mm subcutaneous T1 hyperintense lesion is present in the left occipital scalp, compatible with a sebaceous cyst. No follow-up necessary. Scalp is otherwise unremarkable. Sinuses/Orbits: A single posterior right ethmoid air cells opacified. The paranasal sinuses and mastoid air cells are otherwise clear. The globes and orbits are within normal limits. IMPRESSION: 1. 18 x 13 x 16 mm extra-axial mass lesion anterior to the right frontal lobe compatible with a meningioma. Although there is some mass effect, it is unlikely that this is the cause of the patient's acute symptoms. Surgical follow-up recommended. 2. Minimal white matter disease otherwise likely reflects the sequela of chronic microvascular ischemia, premature for age. Electronically Signed: By: Wynetta Fines.D.  On: 01/04/2020 13:49     Lyndee Hensen, DO 01/04/2020, 5:56 PM PGY-1, [place] Intern pager: [phone], text pages welcome  FPTS Upper-Level Resident Addendum I have independently interviewed and examined the patient. I have discussed the above with the original author and agree with their  documentation. My edits for correction/addition/clarification are in - purple. Please see also any attending notes.  [facility] Service pager: [phone] (text pages welcome through [facility])  Wilber Oliphant, M.D.  PGY-2 01/04/2020 10:48 PM

## 2020-01-04 NOTE — Progress Notes (Addendum)
[facility] Progress Note Patient Name: Anthony Haas DOB: 12-13-1975 MRN: [id number]   Date of Service  01/04/2020  HPI/Events of Note  Bedside RN requesting parameters for Labetalol infusion.  eICU Interventions  Order clarifying parameters for Labetalol infusion entered, although patient is on the [facility] Teaching Service and PCCM is NOT consulted.         Kerry Kass Clarissa Laird 01/04/2020, 10:27 PM

## 2020-01-04 NOTE — Progress Notes (Signed)
Interim Progress Note   Patient will be admitted to ICU under [facility] Teaching Service for labetalol drip in the setting of acute neurological changes and critical hypertension. Discontinuing nitro gtt started in the ED.  Confirmed changes in management with ED RN Tori. Will transfer to floor once off of drip.   Please page [phone] if needed   Wilber Oliphant, M.D.  9:12 PM 01/04/2020

## 2020-01-04 NOTE — ED Notes (Signed)
NTG continues to infuse at 66mcg/min until pharmacy sends Labetalol

## 2020-01-04 NOTE — ED Provider Notes (Signed)
Pt still in waiting room. Transfer from [facility] for MRI. Shows 1.8cm meningioma. Called by radiology. Clinically this doesn't explain his symptoms but it is causing some localized mass effect and recommends outpatient neurosurgery follow-up for that reason.    Virgel Manifold, MD 01/04/20 [phone]

## 2020-01-04 NOTE — ED Provider Notes (Signed)
Presented with concern for dizziness, started slowly then spinning then became more rapid  Has been off of blood pressure medications for over a year  Now feels like symptoms improved but still moving, when trying to focus it feels like things moving No difficulty walking, feels like eyes dilated, no difficulty talking No headache/n/v/other neuro symptoms  Sent from [facility] after tele neuro eval   Physical Exam  BP (!) 213/123 (BP Location: Right Arm)   Pulse 77   Temp 98 F (36.7 C) (Oral)   Resp 19   Ht 5\' 5"  (1.651 m)   Wt 108 kg   SpO2 97%   BMI 39.61 kg/m   Physical Exam  ED Course/Procedures     Procedures  MDM  44yo male who has been off of BP medications for past year presented to [facility] with hypertension and dizziness. Tele Neurology evaluated and recommend inpatient work up and MRI.  MRI shows meningioma but does not explain vertigo. Will need outpt NSU follow up for meningioma. Discussed with Dr. Lorraine Lax and will admit for continued care of symptoms and htn.  Given labetalol IV>       Gareth Morgan, MD 01/05/20 1002

## 2020-01-04 NOTE — Consult Note (Addendum)
[facility] TeleNeurology Consult Services   Date of Service:   01/04/2020 10:12:54  Impression:     .  R42 - Dizziness/ Vertigo/ Giddiness  Comments/Sign-Out: 44 year old male with no significant past medical history presents with symptoms of nausea, dizziness, vertigo that started at 6 AM when he was at work. DDX peripheral vertigo, hypertensive urgency, rule out posterior circulation stroke with MRI brain. Recommend admission for stroke work up.  Metrics: Last known well time: 01/04/2020 06:00 [facility] Notification Time: 01/04/2020 10:12:54 Arrival Time: 01/04/2020 09:26:00 Stamp Time: 01/04/2020 10:12:54 Time First Login Attempt: 01/04/2020 10:16:46 Symptoms: Dizziness, vomiting, nausea. NIHSS Start Assessment Time: 01/04/2020 10:16:00 Patient is not a candidate for Alteplase/Activase. Alteplase Medical Decision: 01/04/2020 10:27:47 Patient was not deemed candidate for Alteplase/Activase thrombolytics because of following reasons: Non focal exam. .  CT head showed no acute hemorrhage or acute core infarct.  Clinical Presentation is not Suggestive of Large Vessel Occlusive Disease  ED Physician notified of diagnostic impression and management plan on 01/04/2020 10:24:23  Our recommendations are outlined below.  Recommendations:     .  Activate Stroke Protocol Admission/Order Set     .  Stroke/Telemetry Floor     .  Neuro Checks     .  Bedside Swallow Eval     .  DVT Prophylaxis     .  IV Fluids, Normal Saline     .  Head of Bed 30 Degrees     .  Euglycemia and Avoid Hyperthermia (PRN Acetaminophen)     .  Antiplatelet Therapy Recommended   Sign Out:     .  Discussed with Emergency Department Provider    ------------------------------------------------------------------------------  History of Present Illness: Patient is a 44 year old Male.  Patient was brought by private transportation with symptoms of Dizziness, vomiting, nausea.  44 year old male  with no significant past medical history presents with symptoms of nausea, dizziness, vertigo that started at 6 AM when he was at work. Patient states that he was stumbling when he was walking. Symptoms are subsiding. Current NIHHS is zero. Patient is able to ambulate unassisted. Patient was not deemed to be a IV alteplase candidate secondary to non focal exam.  Last seen normal was within 4.5 hours. There is no history of hemorrhagic complications or intracranial hemorrhage. There is no history of Recent Anticoagulants. There is no history of recent major surgery. There is no history of recent stroke.  Past Medical History:     . Hypertension      Examination: BP(238/148), Pulse(77), Blood Glucose(146) 1A: Level of Consciousness - Alert; keenly responsive + 0 1B: Ask Month and Age - Both Questions Right + 0 1C: Blink Eyes & Squeeze Hands - Performs Both Tasks + 0 2: Test Horizontal Extraocular Movements - Normal + 0 3: Test Visual Fields - No Visual Loss + 0 4: Test Facial Palsy (Use Grimace if Obtunded) - Normal symmetry + 0 5A: Test Left Arm Motor Drift - No Drift for 10 Seconds + 0 5B: Test Right Arm Motor Drift - No Drift for 10 Seconds + 0 6A: Test Left Leg Motor Drift - No Drift for 5 Seconds + 0 6B: Test Right Leg Motor Drift - No Drift for 5 Seconds + 0 7: Test Limb Ataxia (FNF/Heel-Shin) - No Ataxia + 0 8: Test Sensation - Normal; No sensory loss + 0 9: Test Language/Aphasia - Normal; No aphasia + 0 10: Test Dysarthria - Normal + 0 11: Test Extinction/Inattention -  No abnormality + 0  NIHSS Score: 0  Pre-Morbid Modified Ranking Scale: 0 Points = No symptoms at all   Patient/Family was informed the Neurology Consult would occur via TeleHealth consult by way of interactive audio and video telecommunications and consented to receiving care in this manner.   Patient is being evaluated for possible acute neurologic impairment and high probability of imminent or  life-threatening deterioration. I spent total of 30 minutes providing care to this patient, including time for face to face visit via telemedicine, review of medical records, imaging studies and discussion of findings with providers, the patient and/or family.   Dr Jessica Priest   [facility] [phone]  Case [id number]

## 2020-01-05 ENCOUNTER — Inpatient Hospital Stay (HOSPITAL_COMMUNITY): Payer: 59

## 2020-01-05 ENCOUNTER — Encounter (HOSPITAL_COMMUNITY): Payer: 59

## 2020-01-05 ENCOUNTER — Encounter (HOSPITAL_COMMUNITY): Payer: Self-pay | Admitting: Family Medicine

## 2020-01-05 DIAGNOSIS — I10 Essential (primary) hypertension: Secondary | ICD-10-CM | POA: Diagnosis not present

## 2020-01-05 DIAGNOSIS — I6389 Other cerebral infarction: Secondary | ICD-10-CM | POA: Diagnosis not present

## 2020-01-05 DIAGNOSIS — R42 Dizziness and giddiness: Secondary | ICD-10-CM | POA: Diagnosis not present

## 2020-01-05 DIAGNOSIS — I161 Hypertensive emergency: Secondary | ICD-10-CM | POA: Diagnosis not present

## 2020-01-05 DIAGNOSIS — G4733 Obstructive sleep apnea (adult) (pediatric): Secondary | ICD-10-CM | POA: Diagnosis not present

## 2020-01-05 LAB — ECHOCARDIOGRAM COMPLETE
Height: 65 in
Weight: 3375.68 oz

## 2020-01-05 LAB — HIV ANTIBODY (ROUTINE TESTING W REFLEX): HIV Screen 4th Generation wRfx: NONREACTIVE

## 2020-01-05 LAB — HEMOGLOBIN A1C
Hgb A1c MFr Bld: 5.6 % (ref 4.8–5.6)
Mean Plasma Glucose: 114.02 mg/dL

## 2020-01-05 LAB — MRSA PCR SCREENING: MRSA by PCR: NEGATIVE

## 2020-01-05 LAB — LIPID PANEL
Cholesterol: 189 mg/dL (ref 0–200)
HDL: 27 mg/dL — ABNORMAL LOW (ref >40)
LDL Cholesterol: 108 mg/dL — ABNORMAL HIGH (ref 0–99)
Total CHOL/HDL Ratio: 7 RATIO
Triglycerides: 269 mg/dL — ABNORMAL HIGH (ref <150)
VLDL: 54 mg/dL — ABNORMAL HIGH (ref 0–40)

## 2020-01-05 MED ORDER — PREDNISONE 20 MG PO TABS: 60.0000 mg | ORAL_TABLET | Freq: Every day | ORAL | Status: DC

## 2020-01-05 MED ORDER — ATORVASTATIN CALCIUM 10 MG PO TABS
10.0000 mg | ORAL_TABLET | Freq: Every day | ORAL | Status: DC
  Administered 2020-01-05 – 2020-01-06 (×2): 10 mg via ORAL
  Filled 2020-01-05 (×2): qty 1

## 2020-01-05 MED ORDER — LABETALOL HCL 5 MG/ML IV SOLN
10.0000 mg | INTRAVENOUS | Status: DC | PRN
  Administered 2020-01-05 (×3): 10 mg via INTRAVENOUS
  Filled 2020-01-05 (×3): qty 4

## 2020-01-05 MED ORDER — HYDRALAZINE HCL 20 MG/ML IJ SOLN
20.0000 mg | Freq: Four times a day (QID) | INTRAMUSCULAR | Status: DC | PRN
  Administered 2020-01-05 – 2020-01-06 (×4): 20 mg via INTRAVENOUS
  Filled 2020-01-05 (×4): qty 1

## 2020-01-05 MED ORDER — CHLORHEXIDINE GLUCONATE CLOTH 2 % EX PADS: 6.0000 | MEDICATED_PAD | Freq: Every day | CUTANEOUS | Status: DC

## 2020-01-05 MED ORDER — HYDRALAZINE HCL 20 MG/ML IJ SOLN: 20.0000 mg | Freq: Four times a day (QID) | INTRAMUSCULAR | Status: DC | PRN

## 2020-01-05 MED ORDER — ASPIRIN EC 81 MG PO TBEC
81.0000 mg | DELAYED_RELEASE_TABLET | Freq: Every day | ORAL | Status: DC
  Administered 2020-01-06: 81 mg via ORAL
  Filled 2020-01-05: qty 1

## 2020-01-05 MED ORDER — HYDRALAZINE HCL 20 MG/ML IJ SOLN: 10.0000 mg | INTRAMUSCULAR | Status: DC | PRN

## 2020-01-05 MED ORDER — GADOBUTROL 1 MMOL/ML IV SOLN
9.0000 mL | Freq: Once | INTRAVENOUS | Status: AC | PRN
  Administered 2020-01-05: 9 mL via INTRAVENOUS

## 2020-01-05 NOTE — Progress Notes (Signed)
10:10- BP 194/126, PRN's on MAR were discontinued, provider notified.   13:40- three doses of Labetalol given, HR dropping to 54-60, provider notified.   16:35- family medicine paged to make aware BP 229/141, PRN medications had been discontinued, patient due to transfer, provider gave order for medication and okayed patient transfer to telemetry floor.

## 2020-01-05 NOTE — Progress Notes (Addendum)
Family Medicine Teaching Service Daily Progress Note Intern Pager: [phone]  Patient name: Anthony Haas Medical record number: [id number] Date of birth: 12/23/1975 Age: 44 y.o. Gender: male  Primary Care Provider: Patient, No Pcp Per Consultants: Neurology  Code Status: FULL  Pt Overview and Major Events to Date:  01/04/20:  Admitted, neurology consulted     Assessment and Plan: Anthony Haas is a 44 y.o. male  presenting with dizziness and nausea . PMH is significant for HTN, OSA and autism spectrum disorder.   Concern for CVA  Patient with continued transient dizziness. Patient with nystagmus on exam. Reports nausea has resolved.  MRI was significant for meningioma (18 x 13 x 16 mm) to the right frontal lobe.  However it was felt that the meningioma not likely the cause of patient's symptoms.  A1c 5.6. Lipid panel: Total cholesterol 189, TGs 269, HDL 27, LDL 108. Ultrasound tech performing ECHO and carotid dopplers this morning. Await additional neurological recommendations.   -Consulted neuro stroke team, appreciate recs  -Nursing: Vitals per unit routine, out of bed with assistance only, frequent neuro checks  -Follow up on Imaging: Carotid Dopplers, ECHO -Continue ASA 325 + clopidigrel 75mg   -PT, OT: evaluate and treat  -Permissive HTN up to 220/120 x 24 hours -Consider Neurosurgery consult for meningioma     ?Vestibular Neuritis  Patient with ?rotarty Nystagmus. - Neurology consulted, appreciate recommendations   - Consider consulting vestibular Rehab therapist  - Prednisone 60 mg daily x 5 days, then 40 mg daily x 3 days, then 20 mg daily x 2 days   Hypertensive emergency, resolved  hx of uncontrolled HTN  Patient was transferred to the ICU for Labetalol drip.  Patient BP down trended with quickly and patient was transferred out of the ICU.  Patient with permissive HTN for ?CVA.  BP 170s /110s this morning (while in the room).  Will transfer patient to medical telemetry this  morning. Patient reports he previously was on antihypertensive medications however his blood pressure normalized and he stopped taking the meds.   - s/p IV Labetolol x2 in ED  - Discontinued.  Labetolol gtt with permissive HTN (220 /120)  - Monitor BP    Autism spectrum disorder Diagnosed in Jan 2016 by [facility] in [place]. Per patient, he has high functioning ASD with  difficulties in social situations.    OSA  Patient on CPAP at bedtime.   - CPAP at bedtime   FEN/GI: NPO until passes bedside swallow, replete electrolytes as needed  Prophylaxis: Lovenox   Disposition:Likely home   Subjective:  Patient doing well reports nausea has resolved.  Intermittent dizziness persists.    Objective: Temp:  [98 F (36.7 C)-98.4 F (36.9 C)] 98 F (36.7 C) (05/26 0735) Pulse Rate:  [65-96] 70 (05/26 0600) Resp:  [11-26] 11 (05/26 0600) BP: (120-249)/(78-155) 148/104 (05/26 0600) SpO2:  [91 %-100 %] 92 % (05/26 0600) Weight:  [95.7 kg-110.2 kg] 95.7 kg (05/25 2222) Physical Exam: General: Alert, oriented, sitting upright in bed no acute distress Cardiovascular: Regular rate and rhythm, no murmurs appreciated Respiratory: Clear to auscultation bilaterally, no increased work of breathing Abdomen: Obese abdomen, soft, nontender nondistended Extremities: Normal range of motion, nontender, no lower extremity edema Neurological: A&O x4, lateral nystagmus persists, and 5 of 5 bilateral upper and lower extremities, sensation intact, cranial nerves II through XII grossly intact   Laboratory: Recent Labs  Lab 01/04/20 1001  WBC 6.1  HGB 17.9*  HCT 50.2  PLT 232  Recent Labs  Lab 01/04/20 1001  NA 139  K 4.0  CL 102  CO2 25  BUN 17  CREATININE 1.13  CALCIUM 9.5  PROT 7.4  BILITOT 1.2  ALKPHOS 67  ALT 46*  AST 32  GLUCOSE 146*      Imaging/Diagnostic Tests: CT Code Stroke CTA Head W/WO contrast  Addendum Date: 01/04/2020   ADDENDUM REPORT: 01/04/2020 14:09  ADDENDUM: Right frontal extra-axial mass lesion most compatible with meningioma. This is best seen on the MRI from today. This measures approximately 13 x 15 mm. Electronically Signed   By: Franchot Gallo M.D.   On: 01/04/2020 14:09   Result Date: 01/04/2020 CLINICAL DATA:  Dizziness and nausea EXAM: CT ANGIOGRAPHY HEAD AND NECK TECHNIQUE: Multidetector CT imaging of the head and neck was performed using the standard protocol during bolus administration of intravenous contrast. Multiplanar CT image reconstructions and MIPs were obtained to evaluate the vascular anatomy. Carotid stenosis measurements (when applicable) are obtained utilizing NASCET criteria, using the distal internal carotid diameter as the denominator. CONTRAST:  129mL OMNIPAQUE IOHEXOL 350 MG/ML SOLN COMPARISON:  None. FINDINGS: CT HEAD FINDINGS Brain: Ventricle size normal. Negative for acute infarct, hemorrhage, mass. Vascular: Vessels of the base of brain are diffusely increased density however there is no asymmetry evidence of acute abnormality. This finding may be due to elevated hemoglobin. Skull: Negative. 12 mm dermal cyst in the left occipital region most likely a Pilar cyst. Sinuses: Mild mucosal edema right ethmoid sinus otherwise clear. Orbits: Negative Review of the MIP images confirms the above findings CTA NECK FINDINGS Aortic arch: 4 vessel arch. Left vertebral origin from the arch. Imaged portion shows no evidence of aneurysm or dissection. No significant stenosis of the major arch vessel origins. Right carotid system: Normal right carotid without stenosis or dissection Left carotid system: Normal left carotid without stenosis or dissection Vertebral arteries: Both vertebral arteries widely patent. Left vertebral artery origin from the arch. Skeleton: No acute skeletal abnormality.  Dental caries. Other neck: Negative for mass or adenopathy in the neck. Upper chest: Lung apices clear bilaterally. Review of the MIP images confirms  the above findings CTA HEAD FINDINGS Anterior circulation: Internal carotid artery widely patent bilaterally without stenosis. Anterior and middle cerebral arteries widely patent bilaterally. Posterior circulation: Both vertebral arteries patent to the basilar. Right vertebral dominant. PICA patent bilaterally. Basilar tortuous and widely patent. Superior cerebellar and posterior cerebral arteries patent bilaterally. Fetal origin right posterior cerebral artery. Venous sinuses: Normal venous enhancement Anatomic variants: None Review of the MIP images confirms the above findings IMPRESSION: 1. No acute intracranial abnormality. 2. Negative for intracranial large vessel occlusion 3. No significant carotid or vertebral artery stenosis in the neck 4. No intracranial stenosis. 5. These results were called by telephone at the time of interpretation on 01/04/2020 at 10:34 am to provider DAN FLOYD , who verbally acknowledged these results. Electronically Signed: By: Franchot Gallo M.D. On: 01/04/2020 10:34   DG Chest 2 View  Result Date: 01/04/2020 CLINICAL DATA:  Hypertension EXAM: CHEST - 2 VIEW COMPARISON:  None. FINDINGS: The heart size and mediastinal contours are within normal limits. Both lungs are clear. The visualized skeletal structures are unremarkable. IMPRESSION: No active cardiopulmonary disease. Electronically Signed   By: Ulyses Jarred M.D.   On: 01/04/2020 21:05   CT HEAD WO CONTRAST  Addendum Date: 01/04/2020   ADDENDUM REPORT: 01/04/2020 14:09 ADDENDUM: Right frontal extra-axial mass lesion most compatible with meningioma. This is best seen on the  MRI from today. This measures approximately 13 x 15 mm. Electronically Signed   By: Franchot Gallo M.D.   On: 01/04/2020 14:09   Result Date: 01/04/2020 CLINICAL DATA:  Dizziness and nausea EXAM: CT ANGIOGRAPHY HEAD AND NECK TECHNIQUE: Multidetector CT imaging of the head and neck was performed using the standard protocol during bolus administration of  intravenous contrast. Multiplanar CT image reconstructions and MIPs were obtained to evaluate the vascular anatomy. Carotid stenosis measurements (when applicable) are obtained utilizing NASCET criteria, using the distal internal carotid diameter as the denominator. CONTRAST:  157mL OMNIPAQUE IOHEXOL 350 MG/ML SOLN COMPARISON:  None. FINDINGS: CT HEAD FINDINGS Brain: Ventricle size normal. Negative for acute infarct, hemorrhage, mass. Vascular: Vessels of the base of brain are diffusely increased density however there is no asymmetry evidence of acute abnormality. This finding may be due to elevated hemoglobin. Skull: Negative. 12 mm dermal cyst in the left occipital region most likely a Pilar cyst. Sinuses: Mild mucosal edema right ethmoid sinus otherwise clear. Orbits: Negative Review of the MIP images confirms the above findings CTA NECK FINDINGS Aortic arch: 4 vessel arch. Left vertebral origin from the arch. Imaged portion shows no evidence of aneurysm or dissection. No significant stenosis of the major arch vessel origins. Right carotid system: Normal right carotid without stenosis or dissection Left carotid system: Normal left carotid without stenosis or dissection Vertebral arteries: Both vertebral arteries widely patent. Left vertebral artery origin from the arch. Skeleton: No acute skeletal abnormality.  Dental caries. Other neck: Negative for mass or adenopathy in the neck. Upper chest: Lung apices clear bilaterally. Review of the MIP images confirms the above findings CTA HEAD FINDINGS Anterior circulation: Internal carotid artery widely patent bilaterally without stenosis. Anterior and middle cerebral arteries widely patent bilaterally. Posterior circulation: Both vertebral arteries patent to the basilar. Right vertebral dominant. PICA patent bilaterally. Basilar tortuous and widely patent. Superior cerebellar and posterior cerebral arteries patent bilaterally. Fetal origin right posterior cerebral  artery. Venous sinuses: Normal venous enhancement Anatomic variants: None Review of the MIP images confirms the above findings IMPRESSION: 1. No acute intracranial abnormality. 2. Negative for intracranial large vessel occlusion 3. No significant carotid or vertebral artery stenosis in the neck 4. No intracranial stenosis. 5. These results were called by telephone at the time of interpretation on 01/04/2020 at 10:34 am to provider DAN FLOYD , who verbally acknowledged these results. Electronically Signed: By: Franchot Gallo M.D. On: 01/04/2020 10:34   CT Code Stroke CTA Neck W/WO contrast  Addendum Date: 01/04/2020   ADDENDUM REPORT: 01/04/2020 14:09 ADDENDUM: Right frontal extra-axial mass lesion most compatible with meningioma. This is best seen on the MRI from today. This measures approximately 13 x 15 mm. Electronically Signed   By: Franchot Gallo M.D.   On: 01/04/2020 14:09   Result Date: 01/04/2020 CLINICAL DATA:  Dizziness and nausea EXAM: CT ANGIOGRAPHY HEAD AND NECK TECHNIQUE: Multidetector CT imaging of the head and neck was performed using the standard protocol during bolus administration of intravenous contrast. Multiplanar CT image reconstructions and MIPs were obtained to evaluate the vascular anatomy. Carotid stenosis measurements (when applicable) are obtained utilizing NASCET criteria, using the distal internal carotid diameter as the denominator. CONTRAST:  175mL OMNIPAQUE IOHEXOL 350 MG/ML SOLN COMPARISON:  None. FINDINGS: CT HEAD FINDINGS Brain: Ventricle size normal. Negative for acute infarct, hemorrhage, mass. Vascular: Vessels of the base of brain are diffusely increased density however there is no asymmetry evidence of acute abnormality. This finding may be due  to elevated hemoglobin. Skull: Negative. 12 mm dermal cyst in the left occipital region most likely a Pilar cyst. Sinuses: Mild mucosal edema right ethmoid sinus otherwise clear. Orbits: Negative Review of the MIP images confirms  the above findings CTA NECK FINDINGS Aortic arch: 4 vessel arch. Left vertebral origin from the arch. Imaged portion shows no evidence of aneurysm or dissection. No significant stenosis of the major arch vessel origins. Right carotid system: Normal right carotid without stenosis or dissection Left carotid system: Normal left carotid without stenosis or dissection Vertebral arteries: Both vertebral arteries widely patent. Left vertebral artery origin from the arch. Skeleton: No acute skeletal abnormality.  Dental caries. Other neck: Negative for mass or adenopathy in the neck. Upper chest: Lung apices clear bilaterally. Review of the MIP images confirms the above findings CTA HEAD FINDINGS Anterior circulation: Internal carotid artery widely patent bilaterally without stenosis. Anterior and middle cerebral arteries widely patent bilaterally. Posterior circulation: Both vertebral arteries patent to the basilar. Right vertebral dominant. PICA patent bilaterally. Basilar tortuous and widely patent. Superior cerebellar and posterior cerebral arteries patent bilaterally. Fetal origin right posterior cerebral artery. Venous sinuses: Normal venous enhancement Anatomic variants: None Review of the MIP images confirms the above findings IMPRESSION: 1. No acute intracranial abnormality. 2. Negative for intracranial large vessel occlusion 3. No significant carotid or vertebral artery stenosis in the neck 4. No intracranial stenosis. 5. These results were called by telephone at the time of interpretation on 01/04/2020 at 10:34 am to provider DAN FLOYD , who verbally acknowledged these results. Electronically Signed: By: Franchot Gallo M.D. On: 01/04/2020 10:34   MR BRAIN WO CONTRAST  Addendum Date: 01/04/2020   ADDENDUM REPORT: 01/04/2020 14:06 ADDENDUM: These results were called by telephone at the time of interpretation on 01/04/2020 at 2:06 pm to provider Dr. Wilson Singer., who verbally acknowledged these results. Electronically  Signed   By: San Morelle M.D.   On: 01/04/2020 14:06   Result Date: 01/04/2020 CLINICAL DATA:  Neuro deficit, acute, stroke suspected. Acute onset of dizziness symptoms have since improved. EXAM: MRI HEAD WITHOUT CONTRAST TECHNIQUE: Multiplanar, multiecho pulse sequences of the brain and surrounding structures were obtained without intravenous contrast. COMPARISON:  CT head and CTA head 01/04/2019. FINDINGS: Brain: A well-defined extra-axial mass lesion is present anterior to the right frontal lobe measuring 18 x 13 x 16 mm. The straight some mass effect the anterior right frontal lobe with focal subcortical T2 hyperintensity. No significant extra-axial fluid collection is present. No additional intracranial lesions are present. Minimal white matter changes are noted otherwise. Basal ganglia are intact. Focal T2 hyperintensity within the anterior genu of the corpus callosum likely represents a focal 3 mm remote infarct. The internal auditory canals are within normal limits. The brainstem and cerebellum are within normal limits. The ventricles are of normal size. Vascular: Flow is present in the major intracranial arteries. Skull and upper cervical spine: The craniocervical junction is normal. Upper cervical spine is within normal limits. Marrow signal is unremarkable. A 17 mm subcutaneous T1 hyperintense lesion is present in the left occipital scalp, compatible with a sebaceous cyst. No follow-up necessary. Scalp is otherwise unremarkable. Sinuses/Orbits: A single posterior right ethmoid air cells opacified. The paranasal sinuses and mastoid air cells are otherwise clear. The globes and orbits are within normal limits. IMPRESSION: 1. 18 x 13 x 16 mm extra-axial mass lesion anterior to the right frontal lobe compatible with a meningioma. Although there is some mass effect, it is unlikely that this  is the cause of the patient's acute symptoms. Surgical follow-up recommended. 2. Minimal white matter disease  otherwise likely reflects the sequela of chronic microvascular ischemia, premature for age. Electronically Signed: By: San Morelle M.D. On: 01/04/2020 13:49     Lyndee Hensen, DO 01/05/2020, 9:12 AM PGY-1, [facility] Intern pager: [phone], text pages welcome

## 2020-01-05 NOTE — Evaluation (Addendum)
Speech Language Pathology Evaluation Patient Details Name: Anthony Haas MRN: [id number] DOB: 08-Nov-1975 Today's Date: 01/05/2020 Time: [id number] SLP Time Calculation (min) (ACUTE ONLY): 23 min  Problem List:  Patient Active Problem List   Diagnosis Date Noted  . Hypertensive emergency without congestive heart failure 01/04/2020  . Acute onset of severe vertigo   . Meningioma ([place])   . Essential hypertension   . OSA on CPAP    Past Medical History:  Past Medical History:  Diagnosis Date  . Autism   . High cholesterol   . Hypertension   . Sleep apnea    Past Surgical History:  Past Surgical History:  Procedure Laterality Date  . TONSILLECTOMY     HPI:  Anthony Haas is a 44 y.o. male  presenting with dizziness and nausea . PMH is significant for HTN, OSA and autism spectrum disorder (high functioning), high cholesterol. MRI revealed right frontal extra-axial mass lesion most compatible with meningioma   Assessment / Plan / Recommendation Clinical Impression  Pt reports he lives alone, works in security at [employer] and difficulty with "short term memory" but did not note cognitive changes presently or prior to admission. He exhibits intact language, attention, orientation, abstraction and naming. Required cues to retrieve 2/5 words on working memory on word recall subtest and significant difficulty with visuospatial subtest. His awareness appears intact and therapist does not suspect he will have difficulty at home with ADL's at present time. No further ST      SLP Assessment  SLP Recommendation/Assessment: Patient does not need any further Speech Lanaguage Pathology Services SLP Visit Diagnosis: Cognitive communication deficit (R41.841)    Follow Up Recommendations  None    Frequency and Duration           SLP Evaluation Cognition  Overall Cognitive Status: Within Functional Limits for tasks assessed Arousal/Alertness: Awake/alert Orientation Level: Oriented X4 Attention:  Sustained Sustained Attention: Appears intact Memory: Impaired Memory Impairment: Retrieval deficit Awareness: Appears intact Problem Solving: Appears intact Safety/Judgment: Appears intact       Comprehension  Auditory Comprehension Overall Auditory Comprehension: Appears within functional limits for tasks assessed Visual Recognition/Discrimination Discrimination: Not tested Reading Comprehension Reading Status: Not tested    Expression Expression Primary Mode of Expression: Verbal Verbal Expression Overall Verbal Expression: Appears within functional limits for tasks assessed Written Expression Dominant Hand: Right Written Expression: Not tested   Oral / Motor  Oral Motor/Sensory Function Overall Oral Motor/Sensory Function: Within functional limits Motor Speech Overall Motor Speech: Appears within functional limits for tasks assessed Intelligibility: Intelligible Motor Planning: Witnin functional limits   GO                    Houston Siren 01/05/2020, 3:18 PM  Orbie Pyo Yoshi Vicencio M.Ed Risk analyst [phone] Office [phone]

## 2020-01-05 NOTE — Consult Note (Addendum)
Stroke Neurology Consultation Note  Consult Requested by: Critical Care / Family Practice - seen by teleneurology at [facility] 01/04/2020  Reason for Consult: nausea, dizziness and vertigo - ? stroke  Consult Date:  01/05/20  The history was obtained from the pt and the chart.  During history and examination, all items were  able to obtain unless otherwise noted.  History of Present Illness:  Anthony Haas is an 44 y.o. Caucasian male with PMH of HTN, HLD, OSA and Autism who developed vertigo when looking at his phone at work yesterday. He was able to walk but felt he could not drive, so called his dad to pick him up. EMS called to evaluate, sx had resolved. When he them walked to the car, the vertigo recurred and he became nauseous.. He states his symptoms are transient on both occasions and did not last more than a few minutes.  He denies prior known history of recurrent paroxysmal benign vertigo.  He denies prior history of strokes TIAs seizures.  Denies significant head injury with loss of consciousness.  He denies any history of ear pain infection or recurrent discharges.  Date last known well: Date: 01/04/2020 Time last known well: Time: 06:00 tPA Given: No: non-focal exam MRS:  0 NIHSS:  0  Past Medical History:  Diagnosis Date  . Autism   . High cholesterol   . Hypertension   . Sleep apnea      Past Surgical History:  Procedure Laterality Date  . TONSILLECTOMY      No family history on file.   Social History:  reports previous alcohol use. No history on file for tobacco and drug.  Review of Systems: A full ROS was attempted today and was able to be performed.  Systems assessed include - Constitutional, Eyes, HENT, Respiratory, Cardiovascular, Gastrointestinal, Genitourinary, Integument/breast, Hematologic/lymphatic, Musculoskeletal, Neurological, Behavioral/Psych, Endocrine, Allergic/Immunologic - with pertinent responses as per HPI.  Allergies: No Known  Allergies   Medications:  I have reviewed the patient's current medications. Prior to Admission:  No medications prior to admission.    Test Results: CBC:  Recent Labs  Lab 01/04/20 1001  WBC 6.1  NEUTROABS 3.6  HGB 17.9*  HCT 50.2  MCV 85.2  PLT [id number]   Basic Metabolic Panel:  Recent Labs  Lab 01/04/20 1001  NA 139  K 4.0  CL 102  CO2 25  GLUCOSE 146*  BUN 17  CREATININE 1.13  CALCIUM 9.5   Liver Function Tests: Recent Labs  Lab 01/04/20 1001  AST 32  ALT 46*  ALKPHOS 67  BILITOT 1.2  PROT 7.4  ALBUMIN 4.4   Coagulation Studies:  Recent Labs    01/04/20 1001  LABPROT 14.1  INR 1.1   CBG:  Recent Labs  Lab 01/04/20 1005 01/04/20 2232  GLUCAP 131* 116*   Urinalysis:  Recent Labs  Lab 01/04/20 [employer]  LABSPEC 1.015  PHURINE 7.0  GLUCOSEU NEGATIVE  HGBUR NEGATIVE  BILIRUBINUR NEGATIVE  KETONESUR NEGATIVE  PROTEINUR NEGATIVE  NITRITE NEGATIVE  LEUKOCYTESUR NEGATIVE   Microbiology:  Results for orders placed or performed during the hospital encounter of 01/04/20  SARS Coronavirus 2 by RT PCR (hospital order, performed in [place] hospital lab) Nasopharyngeal Nasopharyngeal Swab     Status: None   Collection Time: 01/04/20 10:58 AM   Specimen: Nasopharyngeal Swab  Result Value Ref Range Status   SARS Coronavirus 2 NEGATIVE NEGATIVE Final    Comment: (NOTE) SARS-CoV-2 target nucleic acids  are NOT DETECTED. The SARS-CoV-2 RNA is generally detectable in upper and lower respiratory specimens during the acute phase of infection. The lowest concentration of SARS-CoV-2 viral copies this assay can detect is 250 copies / mL. A negative result does not preclude SARS-CoV-2 infection and should not be used as the sole basis for treatment or other patient management decisions.  A negative result may occur with improper specimen collection / handling, submission of specimen other than nasopharyngeal swab, presence of viral  mutation(s) within the areas targeted by this assay, and inadequate number of viral copies (<250 copies / mL). A negative result must be combined with clinical observations, patient history, and epidemiological information. Fact Sheet for Patients:   StrictlyIdeas.no Fact Sheet for Healthcare Providers: BankingDealers.co.za This test is not yet approved or cleared  by the [place] FDA and has been authorized for detection and/or diagnosis of SARS-CoV-2 by FDA under an Emergency Use Authorization (EUA).  This EUA will remain in effect (meaning this test can be used) for the duration of the COVID-19 declaration under Section 564(b)(1) of the Act, 21 U.S.C. section 360bbb-3(b)(1), unless the authorization is terminated or revoked sooner. Performed at [facility], [address] [place], [place] [address]   MRSA PCR Screening     Status: None   Collection Time: 01/04/20 10:30 PM   Specimen: Nasopharyngeal  Result Value Ref Range Status   MRSA by PCR NEGATIVE NEGATIVE Final    Comment:        The GeneXpert MRSA Assay (FDA approved for NASAL specimens only), is one component of a comprehensive MRSA colonization surveillance program. It is not intended to diagnose MRSA infection nor to guide or monitor treatment for MRSA infections. Performed at [facility], [place] [address], [place], [place] [address]    Lipid Panel:     Component Value Date/Time   CHOL 189 01/05/2020 0620   TRIG 269 (H) 01/05/2020 0620   HDL 27 (L) 01/05/2020 0620   CHOLHDL 7.0 01/05/2020 0620   VLDL 54 (H) 01/05/2020 0620   LDLCALC 108 (H) 01/05/2020 0620   HgbA1c:  Lab Results  Component Value Date   HGBA1C 5.6 01/05/2020   Urine Drug Screen:     Component Value Date/Time   LABOPIA NONE DETECTED 01/04/2020 1058   COCAINSCRNUR NONE DETECTED 01/04/2020 1058   LABBENZ NONE DETECTED 01/04/2020 1058   AMPHETMU NONE DETECTED  01/04/2020 1058   THCU NONE DETECTED 01/04/2020 1058   LABBARB NONE DETECTED 01/04/2020 1058    Alcohol Level:  Recent Labs  Lab 01/04/20 1001  ETH <10    CT Code Stroke CTA Head W/WO contrast  Addendum Date: 01/04/2020   ADDENDUM REPORT: 01/04/2020 14:09 ADDENDUM: Right frontal extra-axial mass lesion most compatible with meningioma. This is best seen on the MRI from today. This measures approximately 13 x 15 mm. Electronically Signed   By: Franchot Gallo M.D.   On: 01/04/2020 14:09   Result Date: 01/04/2020 CLINICAL DATA:  Dizziness and nausea EXAM: CT ANGIOGRAPHY HEAD AND NECK TECHNIQUE: Multidetector CT imaging of the head and neck was performed using the standard protocol during bolus administration of intravenous contrast. Multiplanar CT image reconstructions and MIPs were obtained to evaluate the vascular anatomy. Carotid stenosis measurements (when applicable) are obtained utilizing NASCET criteria, using the distal internal carotid diameter as the denominator. CONTRAST:  158mL OMNIPAQUE IOHEXOL 350 MG/ML SOLN COMPARISON:  None. FINDINGS: CT HEAD FINDINGS Brain: Ventricle size normal. Negative for acute infarct,  hemorrhage, mass. Vascular: Vessels of the base of brain are diffusely increased density however there is no asymmetry evidence of acute abnormality. This finding may be due to elevated hemoglobin. Skull: Negative. 12 mm dermal cyst in the left occipital region most likely a Pilar cyst. Sinuses: Mild mucosal edema right ethmoid sinus otherwise clear. Orbits: Negative Review of the MIP images confirms the above findings CTA NECK FINDINGS Aortic arch: 4 vessel arch. Left vertebral origin from the arch. Imaged portion shows no evidence of aneurysm or dissection. No significant stenosis of the major arch vessel origins. Right carotid system: Normal right carotid without stenosis or dissection Left carotid system: Normal left carotid without stenosis or dissection Vertebral arteries: Both  vertebral arteries widely patent. Left vertebral artery origin from the arch. Skeleton: No acute skeletal abnormality.  Dental caries. Other neck: Negative for mass or adenopathy in the neck. Upper chest: Lung apices clear bilaterally. Review of the MIP images confirms the above findings CTA HEAD FINDINGS Anterior circulation: Internal carotid artery widely patent bilaterally without stenosis. Anterior and middle cerebral arteries widely patent bilaterally. Posterior circulation: Both vertebral arteries patent to the basilar. Right vertebral dominant. PICA patent bilaterally. Basilar tortuous and widely patent. Superior cerebellar and posterior cerebral arteries patent bilaterally. Fetal origin right posterior cerebral artery. Venous sinuses: Normal venous enhancement Anatomic variants: None Review of the MIP images confirms the above findings IMPRESSION: 1. No acute intracranial abnormality. 2. Negative for intracranial large vessel occlusion 3. No significant carotid or vertebral artery stenosis in the neck 4. No intracranial stenosis. 5. These results were called by telephone at the time of interpretation on 01/04/2020 at 10:34 am to provider DAN FLOYD , who verbally acknowledged these results. Electronically Signed: By: Franchot Gallo M.D. On: 01/04/2020 10:34   DG Chest 2 View  Result Date: 01/04/2020 CLINICAL DATA:  Hypertension EXAM: CHEST - 2 VIEW COMPARISON:  None. FINDINGS: The heart size and mediastinal contours are within normal limits. Both lungs are clear. The visualized skeletal structures are unremarkable. IMPRESSION: No active cardiopulmonary disease. Electronically Signed   By: Ulyses Jarred M.D.   On: 01/04/2020 21:05   CT HEAD WO CONTRAST  Addendum Date: 01/04/2020   ADDENDUM REPORT: 01/04/2020 14:09 ADDENDUM: Right frontal extra-axial mass lesion most compatible with meningioma. This is best seen on the MRI from today. This measures approximately 13 x 15 mm. Electronically Signed   By:  Franchot Gallo M.D.   On: 01/04/2020 14:09   Result Date: 01/04/2020 CLINICAL DATA:  Dizziness and nausea EXAM: CT ANGIOGRAPHY HEAD AND NECK TECHNIQUE: Multidetector CT imaging of the head and neck was performed using the standard protocol during bolus administration of intravenous contrast. Multiplanar CT image reconstructions and MIPs were obtained to evaluate the vascular anatomy. Carotid stenosis measurements (when applicable) are obtained utilizing NASCET criteria, using the distal internal carotid diameter as the denominator. CONTRAST:  188mL OMNIPAQUE IOHEXOL 350 MG/ML SOLN COMPARISON:  None. FINDINGS: CT HEAD FINDINGS Brain: Ventricle size normal. Negative for acute infarct, hemorrhage, mass. Vascular: Vessels of the base of brain are diffusely increased density however there is no asymmetry evidence of acute abnormality. This finding may be due to elevated hemoglobin. Skull: Negative. 12 mm dermal cyst in the left occipital region most likely a Pilar cyst. Sinuses: Mild mucosal edema right ethmoid sinus otherwise clear. Orbits: Negative Review of the MIP images confirms the above findings CTA NECK FINDINGS Aortic arch: 4 vessel arch. Left vertebral origin from the arch. Imaged portion shows no evidence  of aneurysm or dissection. No significant stenosis of the major arch vessel origins. Right carotid system: Normal right carotid without stenosis or dissection Left carotid system: Normal left carotid without stenosis or dissection Vertebral arteries: Both vertebral arteries widely patent. Left vertebral artery origin from the arch. Skeleton: No acute skeletal abnormality.  Dental caries. Other neck: Negative for mass or adenopathy in the neck. Upper chest: Lung apices clear bilaterally. Review of the MIP images confirms the above findings CTA HEAD FINDINGS Anterior circulation: Internal carotid artery widely patent bilaterally without stenosis. Anterior and middle cerebral arteries widely patent  bilaterally. Posterior circulation: Both vertebral arteries patent to the basilar. Right vertebral dominant. PICA patent bilaterally. Basilar tortuous and widely patent. Superior cerebellar and posterior cerebral arteries patent bilaterally. Fetal origin right posterior cerebral artery. Venous sinuses: Normal venous enhancement Anatomic variants: None Review of the MIP images confirms the above findings IMPRESSION: 1. No acute intracranial abnormality. 2. Negative for intracranial large vessel occlusion 3. No significant carotid or vertebral artery stenosis in the neck 4. No intracranial stenosis. 5. These results were called by telephone at the time of interpretation on 01/04/2020 at 10:34 am to provider DAN FLOYD , who verbally acknowledged these results. Electronically Signed: By: Franchot Gallo M.D. On: 01/04/2020 10:34   CT Code Stroke CTA Neck W/WO contrast  Addendum Date: 01/04/2020   ADDENDUM REPORT: 01/04/2020 14:09 ADDENDUM: Right frontal extra-axial mass lesion most compatible with meningioma. This is best seen on the MRI from today. This measures approximately 13 x 15 mm. Electronically Signed   By: Franchot Gallo M.D.   On: 01/04/2020 14:09   Result Date: 01/04/2020 CLINICAL DATA:  Dizziness and nausea EXAM: CT ANGIOGRAPHY HEAD AND NECK TECHNIQUE: Multidetector CT imaging of the head and neck was performed using the standard protocol during bolus administration of intravenous contrast. Multiplanar CT image reconstructions and MIPs were obtained to evaluate the vascular anatomy. Carotid stenosis measurements (when applicable) are obtained utilizing NASCET criteria, using the distal internal carotid diameter as the denominator. CONTRAST:  176mL OMNIPAQUE IOHEXOL 350 MG/ML SOLN COMPARISON:  None. FINDINGS: CT HEAD FINDINGS Brain: Ventricle size normal. Negative for acute infarct, hemorrhage, mass. Vascular: Vessels of the base of brain are diffusely increased density however there is no asymmetry  evidence of acute abnormality. This finding may be due to elevated hemoglobin. Skull: Negative. 12 mm dermal cyst in the left occipital region most likely a Pilar cyst. Sinuses: Mild mucosal edema right ethmoid sinus otherwise clear. Orbits: Negative Review of the MIP images confirms the above findings CTA NECK FINDINGS Aortic arch: 4 vessel arch. Left vertebral origin from the arch. Imaged portion shows no evidence of aneurysm or dissection. No significant stenosis of the major arch vessel origins. Right carotid system: Normal right carotid without stenosis or dissection Left carotid system: Normal left carotid without stenosis or dissection Vertebral arteries: Both vertebral arteries widely patent. Left vertebral artery origin from the arch. Skeleton: No acute skeletal abnormality.  Dental caries. Other neck: Negative for mass or adenopathy in the neck. Upper chest: Lung apices clear bilaterally. Review of the MIP images confirms the above findings CTA HEAD FINDINGS Anterior circulation: Internal carotid artery widely patent bilaterally without stenosis. Anterior and middle cerebral arteries widely patent bilaterally. Posterior circulation: Both vertebral arteries patent to the basilar. Right vertebral dominant. PICA patent bilaterally. Basilar tortuous and widely patent. Superior cerebellar and posterior cerebral arteries patent bilaterally. Fetal origin right posterior cerebral artery. Venous sinuses: Normal venous enhancement Anatomic variants: None Review  of the MIP images confirms the above findings IMPRESSION: 1. No acute intracranial abnormality. 2. Negative for intracranial large vessel occlusion 3. No significant carotid or vertebral artery stenosis in the neck 4. No intracranial stenosis. 5. These results were called by telephone at the time of interpretation on 01/04/2020 at 10:34 am to provider DAN FLOYD , who verbally acknowledged these results. Electronically Signed: By: Franchot Gallo M.D. On:  01/04/2020 10:34   MR BRAIN WO CONTRAST  Addendum Date: 01/04/2020   ADDENDUM REPORT: 01/04/2020 14:06 ADDENDUM: These results were called by telephone at the time of interpretation on 01/04/2020 at 2:06 pm to provider Dr. Wilson Singer., who verbally acknowledged these results. Electronically Signed   By: San Morelle M.D.   On: 01/04/2020 14:06   Result Date: 01/04/2020 CLINICAL DATA:  Neuro deficit, acute, stroke suspected. Acute onset of dizziness symptoms have since improved. EXAM: MRI HEAD WITHOUT CONTRAST TECHNIQUE: Multiplanar, multiecho pulse sequences of the brain and surrounding structures were obtained without intravenous contrast. COMPARISON:  CT head and CTA head 01/04/2019. FINDINGS: Brain: A well-defined extra-axial mass lesion is present anterior to the right frontal lobe measuring 18 x 13 x 16 mm. The straight some mass effect the anterior right frontal lobe with focal subcortical T2 hyperintensity. No significant extra-axial fluid collection is present. No additional intracranial lesions are present. Minimal white matter changes are noted otherwise. Basal ganglia are intact. Focal T2 hyperintensity within the anterior genu of the corpus callosum likely represents a focal 3 mm remote infarct. The internal auditory canals are within normal limits. The brainstem and cerebellum are within normal limits. The ventricles are of normal size. Vascular: Flow is present in the major intracranial arteries. Skull and upper cervical spine: The craniocervical junction is normal. Upper cervical spine is within normal limits. Marrow signal is unremarkable. A 17 mm subcutaneous T1 hyperintense lesion is present in the left occipital scalp, compatible with a sebaceous cyst. No follow-up necessary. Scalp is otherwise unremarkable. Sinuses/Orbits: A single posterior right ethmoid air cells opacified. The paranasal sinuses and mastoid air cells are otherwise clear. The globes and orbits are within normal limits.  IMPRESSION: 1. 18 x 13 x 16 mm extra-axial mass lesion anterior to the right frontal lobe compatible with a meningioma. Although there is some mass effect, it is unlikely that this is the cause of the patient's acute symptoms. Surgical follow-up recommended. 2. Minimal white matter disease otherwise likely reflects the sequela of chronic microvascular ischemia, premature for age. Electronically Signed: By: San Morelle M.D. On: 01/04/2020 13:49   MR BRAIN W CONTRAST  Result Date: 01/05/2020 CLINICAL DATA:  Intracranial mass, follow-up EXAM: MRI HEAD WITH CONTRAST TECHNIQUE: Multiplanar, multiecho pulse sequences of the brain and surrounding structures were obtained with intravenous contrast. CONTRAST:  87mL GADAVIST GADOBUTROL 1 MMOL/ML IV SOLN COMPARISON:  Noncontrast study performed 01/04/2020 FINDINGS: Motion artifact is present. Brain: Approximately 1.4 x 1.9 x 1.5 cm dural-based lesion along the right frontal convexity demonstrates enhancement and remains consistent with a meningioma. There is mild mass effect on the underlying parenchyma. Mild underlying parenchymal edema is also present. Vascular: Unremarkable. Skull and upper cervical spine: No new finding. Sinuses/Orbits: No new finding. Other: None. IMPRESSION: Postcontrast imaging again demonstrates right frontal convexity meningioma. There is mild underlying parenchymal edema. Electronically Signed   By: Macy Mis M.D.   On: 01/05/2020 11:20   ECHOCARDIOGRAM COMPLETE  Result Date: 01/05/2020    ECHOCARDIOGRAM REPORT   Patient Name:   JUWUAN BRUTSCHE Date of  Exam: 01/05/2020 Medical Rec #:  [id number]   Height:       65.0 in Accession #:    [id number]  Weight:       211.0 lb Date of Birth:  05-29-1976   BSA:          2.024 m Patient Age:    4 years    BP:           148/104 mmHg Patient Gender: M           HR:           69 bpm. Exam Location:  Inpatient Procedure: 2D Echo, Cardiac Doppler and Color Doppler Indications:    CVA  History:         Patient has no prior history of Echocardiogram examinations.                 Risk Factors:Hypertension, Dyslipidemia and Obesity.  Sonographer:    Dustin Flock Referring Phys: [place] Comments: Patient is morbidly obese. IMPRESSIONS  1. Left ventricular ejection fraction, by estimation, is 60 to 65%. The left ventricle has normal function. The left ventricle has no regional wall motion abnormalities. There is moderate concentric left ventricular hypertrophy. Left ventricular diastolic parameters are consistent with Grade I diastolic dysfunction (impaired relaxation).  2. Right ventricular systolic function is normal. The right ventricular size is normal. Tricuspid regurgitation signal is inadequate for assessing PA pressure.  3. The mitral valve is grossly normal. No evidence of mitral valve regurgitation. No evidence of mitral stenosis.  4. The aortic valve is tricuspid. Aortic valve regurgitation is not visualized. No aortic stenosis is present.  5. The inferior vena cava is normal in size with greater than 50% respiratory variability, suggesting right atrial pressure of 3 mmHg. Conclusion(s)/Recommendation(s): No intracardiac source of embolism detected on this transthoracic study. A transesophageal echocardiogram is recommended to exclude cardiac source of embolism if clinically indicated. FINDINGS  Left Ventricle: Left ventricular ejection fraction, by estimation, is 60 to 65%. The left ventricle has normal function. The left ventricle has no regional wall motion abnormalities. The left ventricular internal cavity size was normal in size. There is  moderate concentric left ventricular hypertrophy. Left ventricular diastolic parameters are consistent with Grade I diastolic dysfunction (impaired relaxation). Right Ventricle: The right ventricular size is normal. No increase in right ventricular wall thickness. Right ventricular systolic function is normal. Tricuspid  regurgitation signal is inadequate for assessing PA pressure. Left Atrium: Left atrial size was normal in size. Right Atrium: Right atrial size was normal in size. Pericardium: Trivial pericardial effusion is present. Presence of pericardial fat pad. Mitral Valve: The mitral valve is grossly normal. No evidence of mitral valve regurgitation. No evidence of mitral valve stenosis. Tricuspid Valve: The tricuspid valve is grossly normal. Tricuspid valve regurgitation is not demonstrated. No evidence of tricuspid stenosis. Aortic Valve: The aortic valve is tricuspid. Aortic valve regurgitation is not visualized. No aortic stenosis is present. Pulmonic Valve: The pulmonic valve was grossly normal. Pulmonic valve regurgitation is not visualized. No evidence of pulmonic stenosis. Aorta: The aortic root is normal in size and structure. Venous: The inferior vena cava is normal in size with greater than 50% respiratory variability, suggesting right atrial pressure of 3 mmHg. IAS/Shunts: The atrial septum is grossly normal.  LEFT VENTRICLE PLAX 2D LVIDd:         4.70 cm  Diastology LVIDs:         3.20 cm  LV  e' lateral:   6.20 cm/s LV PW:         1.40 cm  LV E/e' lateral: 15.2 LV IVS:        1.40 cm  LV e' medial:    6.85 cm/s LVOT diam:     2.20 cm  LV E/e' medial:  13.7 LV SV:         79 LV SV Index:   39 LVOT Area:     3.80 cm  RIGHT VENTRICLE RV Basal diam:  3.50 cm RV S prime:     16.50 cm/s TAPSE (M-mode): 3.2 cm LEFT ATRIUM           Index       RIGHT ATRIUM           Index LA diam:      3.50 cm 1.73 cm/m  RA Area:     20.30 cm LA Vol (A2C): 40.6 ml 20.06 ml/m RA Volume:   57.80 ml  28.56 ml/m LA Vol (A4C): 64.3 ml 31.78 ml/m  AORTIC VALVE LVOT Vmax:   114.00 cm/s LVOT Vmean:  76.800 cm/s LVOT VTI:    0.207 m  AORTA Ao Root diam: 3.20 cm MITRAL VALVE MV Area (PHT): 3.85 cm     SHUNTS MV Decel Time: 197 msec     Systemic VTI:  0.21 m MV E velocity: 94.10 cm/s   Systemic Diam: 2.20 cm MV A velocity: 108.00 cm/s MV  E/A ratio:  0.87 Eleonore Chiquito MD Electronically signed by Eleonore Chiquito MD Signature Date/Time: 01/05/2020/10:29:19 AM    Final     EKG: normal sinus rhythm.  Physical Examination: Temp:  [98 F (36.7 C)-98.4 F (36.9 C)] 98.3 F (36.8 C) (05/26 1130) Pulse Rate:  [61-88] 61 (05/26 1320) Resp:  [11-26] 13 (05/26 1320) BP: (120-249)/(78-155) 163/111 (05/26 1320) SpO2:  [84 %-97 %] 93 % (05/26 1320) Weight:  [95.7 kg-108 kg] 95.7 kg (05/25 2222) Pleasant middle-aged Caucasian male who is using CPAP for his sleep apnea. . Afebrile. Head is nontraumatic. Neck is supple without bruit.    Cardiac exam no murmur or gallop. Lungs are clear to auscultation. Distal pulses are well felt. Neurological Exam ;  Awake  Alert oriented x 3. Normal speech and language.eye movements full without nystagmus.fundi were not visualized. Vision acuity and fields appear normal. Hearing is normal. Palatal movements are normal. Face symmetric. Tongue midline. Normal strength, tone, reflexes and coordination. Normal sensation. Gait deferred.  Assessment:  Mr. Anthony Haas is a 44 y.o. male with history of HTN, HLD, OSA and Autism presenting with vertigo and nausea. He did not receive IV t-PA due to non-focal exam.   Labyrinthitis   CT head No acute stroke. R meningioma.  CTA head & neck Unremarkable   MRI  R frontal lobe extra-axial mass c/w meningioma. White matter disease premature for age.  MRI w/  R frontal convexity meningioma  2D Echo EF 60-65%. No source of embolus   LDL 108  HgbA1c 5.6   Lovenox 40 mg sq daily for VTE prophylaxis  No antithrombotic prior to admission, now on aspirin 325 mg daily and clopidogrel 75 mg daily. Given no stroke, will d/c plavix and lower aspirin dose to 81  Therapy recommendations:  pending - PT to do vestibular testing  Disposition:  pending   Hypertensive Emergency  BP as high as 249/153   treated w/ nicardipine. Now prn labetalol.   Remains above goal  requiring prn labetalol . [place] for  BP goal normotensive  Hyperlipidemia  Home meds:  No statin  Now on lipitor 10  LDL 108, goal < 100 for non-stroke  Continue statin at discharge  Other Stroke Risk Factors  Hx ETOH use, advised to drink no more than 2 drink(s) a day  Obesity, Body mass index is 35.11 kg/m., recommend weight loss, diet and exercise as appropriate   Obstructive sleep apnea, on CPAP at home  Other Active Problems  Autism spectrum d/o  Hospital day # 1  He presented with sudden onset of vertigo dizziness nausea and gait ataxia possibly from peripheral vestibular dysfunction.  Work-up for cerebrovascular disease has been unyielding so far with negative CT angio grams as well as MRI scan.  MRI shows incidental right frontal meningioma which is likely asymptomatic and does not explain his symptoms and can be followed conservatively with 1-2 yearly follow-up scans.  I do not believe neurosurgical consultation is indicated acutely during this hospitalization but can be arranged outpatient as patient wants.  Recommend physical occupational therapy consults and vestibular rehab assessment and vestibular stabilization exercises.  Recommend aspirin 81 mg only.  Long discussion with patient and care team and therapist and answered questions.  Greater than 50% time during this 70-minute consultation visit was spent on counseling and coordination of care about his vertigo and answering questions. Thank you for this consultation and allowing [place] to participate in the care of this patient.  Antony Contras, MD Medical Director [place] Pager: [phone] 01/05/2020 7:37 PM  To contact Stroke Continuity provider, please refer to [email or web address] After hours, contact General Neurology

## 2020-01-05 NOTE — Plan of Care (Signed)
  Problem: Clinical Measurements: Goal: Diagnostic test results will improve Outcome: Progressing   Problem: Clinical Measurements: Goal: Will remain free from infection Outcome: Progressing   Problem: Clinical Measurements: Goal: Ability to maintain clinical measurements within normal limits will improve Outcome: Progressing   Problem: Health Behavior/Discharge Planning: Goal: Ability to manage health-related needs will improve Outcome: Progressing   

## 2020-01-05 NOTE — Evaluation (Signed)
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 MRI + for R frontal meningioma and chronic microvascular ischemia. PMH: HTN, sleep apnea, high functioning autism.  Clinical Impression  Pt admitted with/for acute nausea/dizziness that has since moderated.  L horizontal nystagmus continues.  Suspect acute neuritis, though will have a vestibularly training therapist assess further.  Pt is at a supervision to independent level for mobility..  Pt currently limited functionally due to the problems listed below.  (see problems list.)  Pt will benefit from PT to maximize function and safety to be able to get home safely with available assist.     Follow Up Recommendations Other (comment);No PT follow up(OPPT only if there is a vestibular need.)    Equipment Recommendations  None recommended by PT    Recommendations for Other Services       Precautions / Restrictions Precautions Precautions: Fall      Mobility  Bed Mobility Overal bed mobility: Independent             General bed mobility comments: HOB flat  Transfers Overall transfer level: Independent Equipment used: None                Ambulation/Gait Ambulation/Gait assistance: Independent   Assistive device: None Gait Pattern/deviations: Step-through pattern   Gait velocity interpretation: 1.31 - 2.62 ft/sec, indicative of limited community ambulator General Gait Details: Nystagmus continues, fairly continuous left beating,  but with pt only mildly symptomatic.  Pt able to scan, change direction and speed, back up and step over obstacle without deviation.  Pt does perceive continued changes, but they are not causing deviation.  Stairs            Wheelchair Mobility    Modified Rankin  (Stroke Patients Only)       Balance Overall balance assessment: Mild deficits observed, not formally tested;Modified Independent                                           Pertinent Vitals/Pain Pain Assessment: No/denies pain    Home Living Family/patient expects to be discharged to:: Private residence Living Arrangements: Alone Available Help at Discharge: Family;Available PRN/intermittently Type of Home: Apartment Home Access: Stairs to enter Entrance Stairs-Rails: Psychiatric nurse of Steps: 2 flights Home Layout: One level Home Equipment: None      Prior Function Level of Independence: Independent               Hand Dominance   Dominant Hand: Right    Extremity/Trunk Assessment   Upper Extremity Assessment Upper Extremity Assessment: Overall WFL for tasks assessed    Lower Extremity Assessment Lower Extremity Assessment: Overall WFL for tasks assessed    Cervical / Trunk Assessment Cervical / Trunk Assessment: Normal  Communication   Communication: No difficulties  Cognition Arousal/Alertness: Awake/alert Behavior During Therapy: WFL for tasks assessed/performed Overall Cognitive Status: Within Functional Limits for tasks assessed                                        General Comments General comments (skin  integrity, edema, etc.): Limited vestibular testing completed due to Left horizontal nystagmus observed, but believe this is acute neuritis     Exercises     Assessment/Plan    PT Assessment Patient needs continued PT services  PT Problem List Decreased activity tolerance;Decreased mobility;Other (comment)(vertigo)       PT Treatment Interventions Functional mobility training;Gait training;Therapeutic activities;Balance training;Patient/family education    PT Goals (Current goals can be found in the Care Plan section)  Acute Rehab PT Goals Patient Stated Goal: back to work PT Goal  Formulation: With patient Time For Goal Achievement: 01/12/20 Potential to Achieve Goals: Good    Frequency Min 3X/week   Barriers to discharge        Co-evaluation               AM-PAC PT "6 Clicks" Mobility  Outcome Measure Help needed turning from your back to your side while in a flat bed without using bedrails?: None Help needed moving from lying on your back to sitting on the side of a flat bed without using bedrails?: None Help needed moving to and from a bed to a chair (including a wheelchair)?: None Help needed standing up from a chair using your arms (e.g., wheelchair or bedside chair)?: None Help needed to walk in hospital room?: None Help needed climbing 3-5 steps with a railing? : None 6 Click Score: 24    End of Session   Activity Tolerance: Patient tolerated treatment well Patient left: in bed;with call bell/phone within reach Nurse Communication: Mobility status PT Visit Diagnosis: Unsteadiness on feet (R26.81);Dizziness and giddiness (R42);Other (comment)(vertigo)    Time: [id number] PT Time Calculation (min) (ACUTE ONLY): 23 min   Charges:   PT Evaluation $PT Eval Low Complexity: 1 Low          01/05/2020  Ginger Carne., PT Acute Rehabilitation Services [phone]  (pager) [phone]  (office)  Anthony Haas 01/05/2020, 5:04 PM

## 2020-01-05 NOTE — Evaluation (Signed)
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 MRI + for R frontal meningioma and chronic microvascular ischemia. PMH: HTN, sleep apnea, high functioning autism.   Clinical Impression   Pt is functioning independently, denied dizziness during assessment, but stated he could "feel my eyes moving." No further OT needs.    Follow Up Recommendations  No OT follow up    Equipment Recommendations  None recommended by OT    Recommendations for Other Services       Precautions / Restrictions        Mobility Bed Mobility Overal bed mobility: Independent             General bed mobility comments: HOB flat  Transfers Overall transfer level: Independent Equipment used: None                  Balance                                           ADL either performed or assessed with clinical judgement   ADL Overall ADL's : Independent                                             Vision Baseline Vision/History: No visual deficits(had lasik sx) Patient Visual Report: No change from baseline       Perception     Praxis      Pertinent Vitals/Pain Pain Assessment: No/denies pain     Hand Dominance Right   Extremity/Trunk Assessment Upper Extremity Assessment Upper Extremity Assessment: Overall WFL for tasks assessed   Lower Extremity Assessment Lower Extremity Assessment: Defer to PT evaluation   Cervical / Trunk Assessment Cervical / Trunk Assessment: Normal   Communication Communication Communication: No difficulties   Cognition Arousal/Alertness: Awake/alert Behavior During Therapy: WFL for tasks assessed/performed Overall Cognitive Status: Within Functional Limits for tasks assessed                                     General Comments       Exercises     Shoulder Instructions      Home Living Family/patient expects to be discharged to:: Private residence Living Arrangements: Alone Available Help at Discharge: Family;Available PRN/intermittently Type of Home: Apartment Home Access: Stairs to enter [employer] of Steps: 2 flights Entrance Stairs-Rails: Right;Left Home Layout: One level     Bathroom Shower/Tub: Teacher, early years/pre: Standard     Home Equipment: None      Lives With: Alone    Prior Functioning/Environment Level of Independence: Independent                 OT Problem List:        OT Treatment/Interventions:      OT Goals(Current goals can be found in the care plan section)    OT Frequency:     Barriers to D/C:            Co-evaluation  AM-PAC OT "6 Clicks" Daily Activity     Outcome Measure Help from another person eating meals?: None Help from another person taking care of personal grooming?: None Help from another person toileting, which includes using toliet, bedpan, or urinal?: None Help from another person bathing (including washing, rinsing, drying)?: None Help from another person to put on and taking off regular upper body clothing?: None Help from another person to put on and taking off regular lower body clothing?: None 6 Click Score: 24   End of Session    Activity Tolerance: Patient tolerated treatment well Patient left: in bed;with call bell/phone within reach  OT Visit Diagnosis: Dizziness and giddiness (R42)                Time: [id number] OT Time Calculation (min): 16 min Charges:  OT General Charges $OT Visit: 1 Visit OT Evaluation $OT Eval Low Complexity: 1 Low  Nestor Lewandowsky, OTR/L Acute Rehabilitation Services Pager: [phone] Office: [phone]  Malka So 01/05/2020, 3:42 PM

## 2020-01-05 NOTE — Progress Notes (Addendum)
FPTS Interim Progress Note  S: checked in on patient. Bps significantly lowered past lower limit goal on labetalol gtt. He is alert and oriented and not complaining of HA, CP, N/V. Currently on CPAP.   O: BP (!) 138/99   Pulse 73   Temp 98.4 F (36.9 C) (Oral)   Resp 15   Ht 5\' 5"  (1.651 m)   Wt 95.7 kg   SpO2 93%   BMI 35.11 kg/m     A/P: Dc labetalol gtt.  PRN hydral >220. Please notify FPTS if reaches this parameter again. Monitor in ICU overnight If continues to be well controlled ON, plan on transfer to regular floor am  Richarda Osmond, DO 01/05/2020, 1:23 AM PGY-2, [facility] pager [phone]

## 2020-01-05 NOTE — Progress Notes (Signed)
  Echocardiogram 2D Echocardiogram has been performed.  Anthony Haas 01/05/2020, 9:07 AM

## 2020-01-06 ENCOUNTER — Encounter (HOSPITAL_COMMUNITY): Payer: 59

## 2020-01-06 DIAGNOSIS — I161 Hypertensive emergency: Secondary | ICD-10-CM | POA: Diagnosis not present

## 2020-01-06 DIAGNOSIS — G4733 Obstructive sleep apnea (adult) (pediatric): Secondary | ICD-10-CM | POA: Diagnosis not present

## 2020-01-06 DIAGNOSIS — F840 Autistic disorder: Secondary | ICD-10-CM | POA: Diagnosis not present

## 2020-01-06 DIAGNOSIS — H8120 Vestibular neuronitis, unspecified ear: Secondary | ICD-10-CM | POA: Diagnosis not present

## 2020-01-06 DIAGNOSIS — I10 Essential (primary) hypertension: Secondary | ICD-10-CM | POA: Diagnosis not present

## 2020-01-06 DIAGNOSIS — R42 Dizziness and giddiness: Secondary | ICD-10-CM | POA: Diagnosis not present

## 2020-01-06 MED ORDER — SENNOSIDES-DOCUSATE SODIUM 8.6-50 MG PO TABS
1.0000 | ORAL_TABLET | Freq: Once | ORAL | Status: AC
  Administered 2020-01-06: 1 via ORAL
  Filled 2020-01-06: qty 1

## 2020-01-06 MED ORDER — HYDROCHLOROTHIAZIDE 12.5 MG PO TABS: 12.5000 mg | ORAL_TABLET | Freq: Every day | ORAL | 0 refills | Status: DC

## 2020-01-06 MED ORDER — ATORVASTATIN CALCIUM 10 MG PO TABS: 10.0000 mg | ORAL_TABLET | Freq: Every day | ORAL | 0 refills | Status: DC

## 2020-01-06 MED ORDER — LOSARTAN POTASSIUM 25 MG PO TABS
25.0000 mg | ORAL_TABLET | Freq: Every day | ORAL | Status: DC
  Administered 2020-01-06: 25 mg via ORAL
  Filled 2020-01-06: qty 1

## 2020-01-06 MED ORDER — CHLORTHALIDONE 25 MG PO TABS: 25.0000 mg | ORAL_TABLET | Freq: Every day | ORAL | Status: DC

## 2020-01-06 MED ORDER — POLYETHYLENE GLYCOL 3350 17 G PO PACK
17.0000 g | PACK | Freq: Every day | ORAL | Status: DC
  Administered 2020-01-06: 17 g via ORAL
  Filled 2020-01-06: qty 1

## 2020-01-06 MED ORDER — LOSARTAN POTASSIUM 25 MG PO TABS: 25.0000 mg | ORAL_TABLET | Freq: Every day | ORAL | 0 refills | Status: DC

## 2020-01-06 MED ORDER — HYDROCHLOROTHIAZIDE 12.5 MG PO CAPS
12.5000 mg | ORAL_CAPSULE | Freq: Every day | ORAL | Status: DC
  Administered 2020-01-06: 12.5 mg via ORAL
  Filled 2020-01-06: qty 1

## 2020-01-06 MED ORDER — SALINE SPRAY 0.65 % NA SOLN
1.0000 | NASAL | Status: DC | PRN
  Administered 2020-01-06: 1 via NASAL
  Filled 2020-01-06: qty 44

## 2020-01-06 MED ORDER — POLYETHYLENE GLYCOL 3350 17 G PO PACK: 17.0000 g | PACK | Freq: Every day | ORAL | 0 refills | Status: AC

## 2020-01-06 NOTE — Progress Notes (Signed)
STROKE TEAM PROGRESS NOTE   SUBJECTIVE Patient is sitting up in bed.  He states he is feeling fine and is no longer dizzy.  He denies any headache, tinnitus or decreased hearing.  He was asked to get up and do Fukuda stepping test and clearly moved off the base and rotated slightly to the left suggesting left peripheral vestibular dysfunction.  OBJECTIVE Vitals:   01/06/20 1355 01/06/20 1514  BP: (!) 166/122 (!) 162/107  Pulse: 100 85  Resp:  17  Temp:  98.4 F (36.9 C)  SpO2:  98%     Physical Exam Pleasant obese middle-aged Caucasian male not in distress. . Afebrile. Head is nontraumatic. Neck is supple without bruit.    Cardiac exam no murmur or gallop. Lungs are clear to auscultation. Distal pulses are well felt. Neurological Exam ;  Awake  Alert oriented x 3. Normal speech and language.eye movements full without nystagmus.fundi were not visualized. Vision acuity and fields appear normal. Hearing is normal. Palatal movements are normal. Face symmetric. Tongue midline. Normal strength, tone, reflexes and coordination. Normal sensation. Gait deferred. Positive Fukuda stepping test with patient moving office and rotating slightly to the left Pertinent Laboratory Studies (past 3 days) / Diagnostics (past 24h) Recent Labs    01/04/20 1001  WBC 6.1  HGB 17.9*  PLT 232  NA 139  K 4.0  CREATININE 1.13  GLUCOSE 146*    No results found.   ASSESSMENT Mr. Anthony Haas is a 44 y.o. male with  history of HTN, HLD, OSA and Autism presenting with vertigo and nausea. He did not receive IV t-PA due to non-focal exam.   MRI scan is negative for acute infarct or brainstem pathology.  Physical exam suggests left-sided peripheral vestibular dysfunction.    RECOMMENDATIONS Continue physical therapy for vestibular rehab exercises.  Continue ongoing medical therapy.  No further neurological testing is necessary.  Follow-up as an outpatient with stroke clinic if necessary. Stroke team will  sign off.  Kindly call for questions.  Hospital day # [place], MD [place] for Schedule & Pager information 01/06/2020 3:51 PM    To contact Stroke Continuity provider, please refer to [email or web address] After hours, contact General Neurology

## 2020-01-06 NOTE — Progress Notes (Signed)
Patient denies any dizziness or vertigo today. He said he has been moving around fine without any issues. He endorsed a stuffy nose--otherwise, no other concerns.  Exam: HEENT: EOMI, PERRLA. Neuro: He is awake and alert. Oriented x 3. No facial asymmetry or slurring of his speech. Gait steady. CN II-XII intact, no sensory loss, ++ DTR biceps. Power of 5/5vglobally. Heart: S1 S2 normal, no murmur. RRR. Lungs: CTA B/L. Abd: Soft, NT/ND, BS+  Ext: No edema.  A/P: Severe vertigo: Now asymptomatic. Outpatient vestibular rehab is recommended. F/U as needed. PT recommended no outpatient f/u except for vestibular rehab.  Meningioma on his MRI brain. I discussed the need for Neurosurg f/u. This will be arranged as an outpatient.   HTN: Started on Losartran 25 mg QD and HCTZ 12.5 mg QD. Monitor for improvement.

## 2020-01-06 NOTE — Progress Notes (Signed)
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 MRI + for R frontal meningioma and chronic microvascular ischemia. PMH: HTN, sleep apnea, high functioning autism.    PT Comments    Patient seen for assessment with resting BP 159/105 and after walking in hallway 166/122--RN made aware and in to give pt additional BP meds. Pt continues with left-beating nystagmus at rest; increases when gazes left, slows when gazes to right. Denies hearing changes. Denies double vision, but things look like they are sliding to the right over and over again. Occasional nausea, but none during assessment.   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 Functionally he is doing well without imbalance or staggering gait. Educated he may progress to not needing OPPT for vestibular rehab by the time he returns from his 3 week vacation. If so, he can cancel OPPT appointment.    Follow Up Recommendations  Outpatient PT(for vestibular rehab after returns from 3 wk vacation)     Equipment Recommendations  None recommended by PT    Recommendations for Other Services       Precautions / Restrictions Precautions Precautions: Fall    Mobility  Bed Mobility Overal bed mobility: Independent             General bed mobility comments: HOB flat  Transfers Overall transfer level: Independent Equipment used: None                Ambulation/Gait Ambulation/Gait assistance: Independent Gait Distance (Feet): 350 Feet Assistive device: None Gait Pattern/deviations: Step-through pattern;Decreased stride length;Wide base of support   Gait velocity interpretation: 1.31 - 2.62 ft/sec, indicative of limited community ambulator General Gait Details: see DGI   Stairs              Wheelchair Mobility    Modified Rankin (Stroke Patients Only)       Balance                 Single Leg Stance - Right Leg: 5 Single Leg Stance - Left Leg: 3     Rhomberg - Eyes Opened: 30 [place]omberg - Eyes Closed: 10(stopped testing at 10) High level balance activites: Backward walking;Direction changes;Turns;Sudden stops;Head turns High Level Balance Comments: no imbalance  Standardized Balance Assessment Standardized Balance Assessment : Dynamic Gait Index   Dynamic Gait Index Level Surface: Normal Change in Gait Speed: Moderate Impairment Gait with Horizontal Head Turns: Mild Impairment Gait with Vertical Head Turns: Normal Gait and Pivot Turn: Normal Step Over Obstacle: Normal Step Around Obstacles: Normal Steps: Mild Impairment Total Score: 20      Cognition Arousal/Alertness: Awake/alert Behavior During Therapy: Flat affect Overall Cognitive Status: Within Functional Limits for tasks assessed                                        Exercises      General Comments General comments (skin integrity, edema, etc.): Pt continues with left-beating nystagmus at rest; increases when gazes left, slows when gaze to right. Denies hearing changes. Reports felt nauseated after MD had him do ?Fukudo test (marching with eyes closed). Denies double vision, but thinks look like they are sliding to the right over and  over      Pertinent Vitals/Pain Pain Assessment: 0-10 Pain Score: 3  Pain Location: head Pain Descriptors / Indicators: Headache Pain Intervention(s): Limited activity within patient's tolerance;Monitored during session;Patient requesting pain meds-RN notified;RN gave pain meds during session    Home Living                      Prior Function            PT Goals (current goals can now be found in the care plan section) Acute Rehab PT Goals Patient Stated Goal: back to work Time For Goal Achievement: 01/12/20 Potential  to Achieve Goals: Good Progress towards PT goals: Progressing toward goals    Frequency    Min 3X/week      PT Plan Current plan remains appropriate    Co-evaluation              AM-PAC PT "6 Clicks" Mobility   Outcome Measure  Help needed turning from your back to your side while in a flat bed without using bedrails?: None Help needed moving from lying on your back to sitting on the side of a flat bed without using bedrails?: None Help needed moving to and from a bed to a chair (including a wheelchair)?: None Help needed standing up from a chair using your arms (e.g., wheelchair or bedside chair)?: None Help needed to walk in hospital room?: None Help needed climbing 3-5 steps with a railing? : None 6 Click Score: 24    End of Session Equipment Utilized During Treatment: Gait belt Activity Tolerance: Patient tolerated treatment well Patient left: in bed;with call bell/phone within reach Nurse Communication: Mobility status;Other (comment)(elevated BP after ambulation) PT Visit Diagnosis: Unsteadiness on feet (R26.81);Dizziness and giddiness (R42);Other (comment)(vertigo)     Time: 1331-1400 PT Time Calculation (min) (ACUTE ONLY): 29 min  Charges:  $Gait Training: 8-22 mins $Neuromuscular Re-education: 8-22 mins                      Arby Barrette, PT Pager [phone]    Rexanne Mano 01/06/2020, 2:15 PM

## 2020-01-06 NOTE — Discharge Summary (Signed)
Anthony Haas [facility] Discharge Summary  Patient name: Anthony Haas Medical record number: [id number] Date of birth: 1976/01/30 Age: 44 y.o. Gender: male Date of Admission: 01/04/2020  Date of Discharge: 01/06/20 Admitting Physician: Wilber Oliphant, MD  Primary Care Provider: Patient, No Pcp Per Consultants: Neurology   Indication for Hospitalization: hypertensive emergency, concern for CVA   Discharge Diagnoses/Problem List:  Hypertensive emergency / hx of uncontrolled HTN Vestibular Neuritis  Autism spectrum disorder Obstructive sleep apnea  Disposition: Home   Discharge Condition: Improving, Stable   Discharge Exam: General: Alert, oriented, sitting upright in bed no acute distress Cardiovascular: Regular rate and rhythm, no murmurs appreciated Respiratory: Clear to auscultation bilaterally, no increased work of breathing Abdomen: Obese abdomen, soft, nontender nondistended Extremities: Normal range of motion, nontender, no lower extremity edema Neurological: A&O x4, lateral nystagmus persists, and 5 of 5 bilateral upper and lower extremities, sensation intact, cranial nerves II through XII grossly intact   Brief Hospital Course:  Anthony Haas is a 44 y.o. male presenting with dizziness and nausea . PMH is significant for HTN, OSA and autism spectrum disorder.   Concern for CVA  Vestibular neuritis Patient transferred from [facility] after teleneurologist recommended CVA work-up.  Patient presented with acute onset dizziness, vomiting and nausea on 01/04/20. Pt's Dad drove hime to [place] ED. MRI obtained was significant for meningioma (18 x 13 x 16 mm) to the right frontal lobe.  However, it was felt that the meningioma was not likely the cause of patient's symptoms.  Premature (for age) chronic microvascular ischemia was noted. CTA head and neck was grossly unremarkable. 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  Lab  work up including UDS, EtOH  UA, CBC and CMP were grossly unremarkable.  In-house neurology was consulted and stroke work-up was discontinued.  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  Patient to follow-up with vestibular rehab outpatient.  Additionally, he should follow up with neurosurgery for incidental meningioma.  Hypertensive emergency SBP reached 240s / 150s.  Required labetalol gtt  to control his blood pressures despite 24-hour permissive hypertension parameters.  He was started on HCTZ 12.5 mg daily and losartan 25 mg daily. Blood pressure was 147/84 prior to discharge. Patient will need continued titration of his antihypertensive medications.  Hyperlipidemia Lipid panel collected this admission significant for glycerides 269, HDL 27, LDL 108.  Total cholesterol was 189.  Patient was started on Lipitor 10 mg daily.    Issues for Follow Up:  1. Continue to titrate blood pressure medications. Patient discharged on 25mg  Losartan daily and 12.5 mg HCTZ daily. Recheck BMP at follow up.  2. Pt to follow up with Vestibular Rehab outpatient  3. Patient should follow up with neurosurgery for incidental meningioma found on head imaging. Referral was placed at discharge.  4. Patient started on Lipitor 10 mg daily  Significant Procedures: None   Significant Labs and Imaging:  Recent Labs  Lab 01/04/20 1001  WBC 6.1  HGB 17.9*  HCT 50.2  PLT 232   Recent Labs  Lab 01/04/20 1001  NA 139  K 4.0  CL 102  CO2 25  GLUCOSE 146*  BUN 17  CREATININE 1.13  CALCIUM 9.5  ALKPHOS 67  AST 32  ALT 46*  ALBUMIN 4.4   Lipid Panel     Component Value Date/Time   CHOL 189 01/05/2020 0620   TRIG 269 (H) 01/05/2020 0620   HDL 27 (L)  01/05/2020 0620   CHOLHDL 7.0 01/05/2020 0620   VLDL 54 (H) 01/05/2020 0620   LDLCALC 108 (H) 01/05/2020 0620    DG Chest 2 View  Result Date: 01/04/2020 CLINICAL DATA:  Hypertension EXAM: CHEST - 2 VIEW COMPARISON:  None.  FINDINGS: The heart size and mediastinal contours are within normal limits. Both lungs are clear. The visualized skeletal structures are unremarkable. IMPRESSION: No active cardiopulmonary disease. Electronically Signed   By: Ulyses Jarred M.D.   On: 01/04/2020 21:05   MR BRAIN W CONTRAST  Result Date: 01/05/2020 CLINICAL DATA:  Intracranial mass, follow-up EXAM: MRI HEAD WITH CONTRAST TECHNIQUE: Multiplanar, multiecho pulse sequences of the brain and surrounding structures were obtained with intravenous contrast. CONTRAST:  16mL GADAVIST GADOBUTROL 1 MMOL/ML IV SOLN COMPARISON:  Noncontrast study performed 01/04/2020 FINDINGS: Motion artifact is present. Brain: Approximately 1.4 x 1.9 x 1.5 cm dural-based lesion along the right frontal convexity demonstrates enhancement and remains consistent with a meningioma. There is mild mass effect on the underlying parenchyma. Mild underlying parenchymal edema is also present. Vascular: Unremarkable. Skull and upper cervical spine: No new finding. Sinuses/Orbits: No new finding. Other: None. IMPRESSION: Postcontrast imaging again demonstrates right frontal convexity meningioma. There is mild underlying parenchymal edema. Electronically Signed   By: Macy Mis M.D.   On: 01/05/2020 11:20   ECHOCARDIOGRAM COMPLETE  Result Date: 01/05/2020    ECHOCARDIOGRAM REPORT   Patient Name:   Anthony Haas Date of Exam: 01/05/2020 Medical Rec #:  [id number]   Height:       65.0 in Accession #:    [id number]  Weight:       211.0 lb Date of Birth:  September 20, 1975   BSA:          2.024 m Patient Age:    42 years    BP:           148/104 mmHg Patient Gender: M           HR:           69 bpm. Exam Location:  Inpatient Procedure: 2D Echo, Cardiac Doppler and Color Doppler Indications:    CVA  History:        Patient has no prior history of Echocardiogram examinations.                 Risk Factors:Hypertension, Dyslipidemia and Obesity.  Sonographer:    Dustin Flock Referring Phys: [place] Comments: Patient is morbidly obese. IMPRESSIONS  1. Left ventricular ejection fraction, by estimation, is 60 to 65%. The left ventricle has normal function. The left ventricle has no regional wall motion abnormalities. There is moderate concentric left ventricular hypertrophy. Left ventricular diastolic parameters are consistent with Grade I diastolic dysfunction (impaired relaxation).  2. Right ventricular systolic function is normal. The right ventricular size is normal. Tricuspid regurgitation signal is inadequate for assessing PA pressure.  3. The mitral valve is grossly normal. No evidence of mitral valve regurgitation. No evidence of mitral stenosis.  4. The aortic valve is tricuspid. Aortic valve regurgitation is not visualized. No aortic stenosis is present.  5. The inferior vena cava is normal in size with greater than 50% respiratory variability, suggesting right atrial pressure of 3 mmHg. Conclusion(s)/Recommendation(s): No intracardiac source of embolism detected on this transthoracic study. A transesophageal echocardiogram is recommended to exclude cardiac source of embolism if clinically indicated. FINDINGS  Left Ventricle: Left ventricular ejection fraction, by estimation, is 60 to 65%. The left  ventricle has normal function. The left ventricle has no regional wall motion abnormalities. The left ventricular internal cavity size was normal in size. There is  moderate concentric left ventricular hypertrophy. Left ventricular diastolic parameters are consistent with Grade I diastolic dysfunction (impaired relaxation). Right Ventricle: The right ventricular size is normal. No increase in right ventricular wall thickness. Right ventricular systolic function is normal. Tricuspid regurgitation signal is inadequate for assessing PA pressure. Left Atrium: Left atrial size was normal in size. Right Atrium: Right atrial size was normal in size. Pericardium: Trivial pericardial  effusion is present. Presence of pericardial fat pad. Mitral Valve: The mitral valve is grossly normal. No evidence of mitral valve regurgitation. No evidence of mitral valve stenosis. Tricuspid Valve: The tricuspid valve is grossly normal. Tricuspid valve regurgitation is not demonstrated. No evidence of tricuspid stenosis. Aortic Valve: The aortic valve is tricuspid. Aortic valve regurgitation is not visualized. No aortic stenosis is present. Pulmonic Valve: The pulmonic valve was grossly normal. Pulmonic valve regurgitation is not visualized. No evidence of pulmonic stenosis. Aorta: The aortic root is normal in size and structure. Venous: The inferior vena cava is normal in size with greater than 50% respiratory variability, suggesting right atrial pressure of 3 mmHg. IAS/Shunts: The atrial septum is grossly normal.  LEFT VENTRICLE PLAX 2D LVIDd:         4.70 cm  Diastology LVIDs:         3.20 cm  LV e' lateral:   6.20 cm/s LV PW:         1.40 cm  LV E/e' lateral: 15.2 LV IVS:        1.40 cm  LV e' medial:    6.85 cm/s LVOT diam:     2.20 cm  LV E/e' medial:  13.7 LV SV:         79 LV SV Index:   39 LVOT Area:     3.80 cm  RIGHT VENTRICLE RV Basal diam:  3.50 cm RV S prime:     16.50 cm/s TAPSE (M-mode): 3.2 cm LEFT ATRIUM           Index       RIGHT ATRIUM           Index LA diam:      3.50 cm 1.73 cm/m  RA Area:     20.30 cm LA Vol (A2C): 40.6 ml 20.06 ml/m RA Volume:   57.80 ml  28.56 ml/m LA Vol (A4C): 64.3 ml 31.78 ml/m  AORTIC VALVE LVOT Vmax:   114.00 cm/s LVOT Vmean:  76.800 cm/s LVOT VTI:    0.207 m  AORTA Ao Root diam: 3.20 cm MITRAL VALVE MV Area (PHT): 3.85 cm     SHUNTS MV Decel Time: 197 msec     Systemic VTI:  0.21 m MV E velocity: 94.10 cm/s   Systemic Diam: 2.20 cm MV A velocity: 108.00 cm/s MV E/A ratio:  0.87 Eleonore Chiquito MD Electronically signed by Eleonore Chiquito MD Signature Date/Time: 01/05/2020/10:29:19 AM    Final       Results/Tests Pending at Time of Discharge:   Discharge  Medications:  Allergies as of 01/06/2020   No Known Allergies     Medication List    TAKE these medications   atorvastatin 10 MG tablet Commonly known as: LIPITOR Take 1 tablet (10 mg total) by mouth daily.   hydrochlorothiazide 12.5 MG tablet Commonly known as: HYDRODIURIL Take 1 tablet (12.5 mg total) by mouth daily.  losartan 25 MG tablet Commonly known as: COZAAR Take 1 tablet (25 mg total) by mouth daily.   polyethylene glycol 17 g packet Commonly known as: MIRALAX / GLYCOLAX Take 17 g by mouth daily.       Discharge Instructions: Please refer to Patient Instructions section of EMR for full details.  Patient was counseled important signs and symptoms that should prompt return to medical care, changes in medications, dietary instructions, activity restrictions, and follow up appointments.   Follow-Up Appointments: Follow-up Information    Internal Medicine at [facility] Follow up.   Why: The MD office will contact you for the first appointment.  Contact information: [employer], [address] [place], [place] [address]  [phone]       [facility] Follow up.   Specialty: Rehabilitation Why: The outpatient therapy will contact you for the first appointment. Contact information: [address] [place] [id number] [place]. Go on 01/14/2020.   Specialty: Family Medicine Why: Dr. Clemetine Marker at 8:50 AM  Contact information: [address] [id number] [place] [phone]       [place]. Schedule an appointment as soon as possible for a visit.           Lyndee Hensen, DO 01/08/2020, 6:02 PM PGY-1, [place]

## 2020-01-06 NOTE — Hospital Course (Signed)
Outpatient follow up with neurosurgery  Started HCTZ 12.5 and losartan 25 mg PTD

## 2020-01-06 NOTE — TOC Initial Note (Signed)
Transition of Care [facility]) - Initial/Assessment Note    Patient Details  Name: Anthony Haas MRN: [id number] Date of Birth: [birth year]  Transition of Care [facility]) CM/SW Contact:    Pollie Friar, RN Phone Number: 01/06/2020, 3:52 PM  Clinical Narrative:                 Recommendations for outpatient therapy. CM met with the patient and he wants to attend [facility]. Orders in Epic and information on the AVS.  Pt without a PCP. He prefers one in [place]. CM has spoken with internal med clinic in [facility] and they will call him with details for first appt. Information on the AVS.  DME at home: CPAP Pt drives and doesn't currently take any medications at home. Dad at bedside and will provide needed transportation.   Expected Discharge Plan: OP Rehab Barriers to Discharge: Continued Medical Work up   Patient Goals and CMS Choice     Choice offered to / list presented to : Patient  Expected Discharge Plan and Services Expected Discharge Plan: OP Rehab   Discharge Planning Services: CM Consult   Living arrangements for the past 2 months: Apartment                                      Prior Living Arrangements/Services Living arrangements for the past 2 months: Apartment Lives with:: Self Patient language and need for interpreter reviewed:: Yes Do you feel safe going back to the place where you live?: Yes      Need for Family Participation in Patient Care: No (Comment) Care giver support system in place?: Yes (comment)   Criminal Activity/Legal Involvement Pertinent to Current Situation/Hospitalization: No - Comment as needed  Activities of Daily Living Home Assistive Devices/Equipment: None ADL Screening (condition at time of admission) Patient's cognitive ability adequate to safely complete daily activities?: Yes Is the patient deaf or have difficulty hearing?: No Does the patient have difficulty seeing, even when wearing glasses/contacts?: No Does  the patient have difficulty concentrating, remembering, or making decisions?: No Patient able to express need for assistance with ADLs?: No Does the patient have difficulty dressing or bathing?: No Independently performs ADLs?: Yes (appropriate for developmental age) Does the patient have difficulty walking or climbing stairs?: No Weakness of Legs: None Weakness of Arms/Hands: None  Permission Sought/Granted                  Emotional Assessment Appearance:: Appears stated age Attitude/Demeanor/Rapport: Engaged Affect (typically observed): Accepting Orientation: : Oriented to Self, Oriented to Place, Oriented to  Time, Oriented to Situation   Psych Involvement: No (comment)  Admission diagnosis:  Stroke [facility]) [I63.9] Hypertensive emergency without congestive heart failure [I16.1] Acute onset of severe vertigo [R42] Patient Active Problem List   Diagnosis Date Noted  . Hypertensive emergency without congestive heart failure 01/04/2020  . Acute onset of severe vertigo   . Meningioma ([place])   . Essential hypertension   . OSA on CPAP    PCP:  Patient, No Pcp Per Pharmacy:   [id number] [id number]: [phone]: [phone]    Social Determinants of Health (SDOH) Interventions    Readmission Risk Interventions No flowsheet data found.

## 2020-01-06 NOTE — Progress Notes (Signed)
PT Cancellation Note  Patient Details Name: Anthony Haas MRN: [id number] DOB: Dec 25, 1975   Cancelled Treatment:    Reason Eval/Treat Not Completed: Medical issues which prohibited therapy  Patient's BP 191/108 at rest (~1 hour after getting BP meds). Notified Priscilla, RN and she will give prn BP meds.   Noted orders for BP <180 and neurologist recommending pt be normotensive, therefore activity not indicated at this time. Will attempt to see after meds and if BP better controlled.   Arby Barrette, PT Pager [phone]   Rexanne Mano 01/06/2020, 9:26 AM

## 2020-01-06 NOTE — Progress Notes (Signed)
Pt found to be quadgeminy on the monitor. Pt asymptomatic and denies any discomfort. MD on unit notified and new order received. Will continue to closely monitor. Delia Heady RN

## 2020-01-06 NOTE — Progress Notes (Signed)
Pt discharge home with family to transport him home. Pt discharge by RN Courtlanda. Pt transported off via wheelchair with belongings to the side. Delia Heady RN

## 2020-01-07 ENCOUNTER — Other Ambulatory Visit: Payer: Self-pay | Admitting: Family Medicine

## 2020-01-14 ENCOUNTER — Encounter: Payer: Self-pay | Admitting: Family Medicine

## 2020-01-14 ENCOUNTER — Other Ambulatory Visit: Payer: Self-pay

## 2020-01-14 ENCOUNTER — Ambulatory Visit (INDEPENDENT_AMBULATORY_CARE_PROVIDER_SITE_OTHER): Payer: 59 | Admitting: Family Medicine

## 2020-01-14 VITALS — BP 202/98 | HR 86 | Ht 65.0 in | Wt 236.8 lb

## 2020-01-14 DIAGNOSIS — E7800 Pure hypercholesterolemia, unspecified: Secondary | ICD-10-CM | POA: Insufficient documentation

## 2020-01-14 DIAGNOSIS — R42 Dizziness and giddiness: Secondary | ICD-10-CM | POA: Diagnosis not present

## 2020-01-14 DIAGNOSIS — I161 Hypertensive emergency: Secondary | ICD-10-CM | POA: Diagnosis not present

## 2020-01-14 DIAGNOSIS — I10 Essential (primary) hypertension: Secondary | ICD-10-CM | POA: Diagnosis not present

## 2020-01-14 DIAGNOSIS — Z9189 Other specified personal risk factors, not elsewhere classified: Secondary | ICD-10-CM | POA: Insufficient documentation

## 2020-01-14 MED ORDER — LOSARTAN POTASSIUM 50 MG PO TABS: 50.0000 mg | ORAL_TABLET | Freq: Every day | ORAL | 0 refills | Status: DC

## 2020-01-14 MED ORDER — AMLODIPINE BESYLATE 5 MG PO TABS
5.0000 mg | ORAL_TABLET | Freq: Every day | ORAL | 0 refills | Status: DC
Start: 2020-01-14 — End: 2020-02-04

## 2020-01-14 NOTE — Patient Instructions (Addendum)
It was nice to see you today,  We made the following changes today. -I am increasing your losartan dose to 50 mg -I am adding amlodipine 5 mg daily -I am going to put in a referral to the vestibular rehab -I am going to reach out to our social worker to see if she can help you with finding a PCP.  The vestibular rehab phone number is: [facility]) [phone] Please call them if you have not heard from anybody in a week.  I would like to see you back next week or the week after at the latest so I can recheck your blood pressure and adjust her medications accordingly.  Have a great day,  Clemetine Marker, MD

## 2020-01-14 NOTE — Assessment & Plan Note (Signed)
Still elevated today.  202/98.  Has been compliant with his losartan and hydrochlorothiazide.  No symptoms of hypertensive emergency. previous symptoms appear to be improving. -Increase losartan to 50 mg daily -Continue HCTZ at 12.5 mg daily -Add amlodipine 5 mg -Follow-up BMP -Follow-up in approximately 7 to 10 days.

## 2020-01-14 NOTE — Assessment & Plan Note (Signed)
Patient has no PCP currently.  In effort to assist patient in locating a PCP was made during hospital admission by the social workers, but it does not appear anything is, this since discharge.  He would like to see somebody in [facility] or near his parents house. -We will forward to Neoma Laming our Education officer, museum for help with assisting in finding a PCP for patient.

## 2020-01-14 NOTE — Progress Notes (Signed)
    SUBJECTIVE:   CHIEF COMPLAINT / HPI:   Hypertension: has bp cuff at home but hasn't used it. His vision is getting better.  Sometimes has blurriness when looking at a computer screen.  No other symptoms.     Dizziness: the last time he had dizziness was Monday at work.  It felt like he was drifting to the right.  Not a vertigo type feeling like in the hospital. vestibular rehab. neurosurg f/u.   Hld: Patient taking his statin in the morning.  Discussed with patient regarding taking them before bed.  He states he works third shift and gets out of work at around 8 AM when he works.  Pcp: Patient has not found a PCP yet and has not heard from anybody regarding scheduling an appointment.  PERTINENT  PMH / PSH: Autism, hypertension.  OBJECTIVE:   BP (!) 202/98   Pulse 86   Ht 5\' 5"  (1.651 m)   Wt 236 lb 12.8 oz (107.4 kg)   SpO2 98%   BMI 39.41 kg/m   General: Alert and oriented.  No acute distress.  Mother in room with him. CV: Regular rate and rhythm.  No murmurs. Pulmonary: Lungs clear to auscultation bilaterally.  No wheezes or crackles. Abdomen: Soft, distended/large pannus.  Normal bowel sounds.  Nontender. Extremities: No lower extremity edema Psych: Full affect.  Sometimes has difficulty following conversation.  Peers to rely on mother for help remembering things/staying organized.  ASSESSMENT/PLAN:   Essential hypertension Still elevated today.  202/98.  Has been compliant with his losartan and hydrochlorothiazide.  No symptoms of hypertensive emergency. previous symptoms appear to be improving. -Increase losartan to 50 mg daily -Continue HCTZ at 12.5 mg daily -Add amlodipine 5 mg -Follow-up BMP -Follow-up in approximately 7 to 10 days.  High cholesterol Taking atorvastatin 10 mg.  Advised patient to take it before bedtime.  Dizziness Patient states this has improved since his discharge.  He only had one episode while working Monday night where he felt like his  body wanted to drift to the right.  That resolved.  He is not feeling the vertigo-like sensation of the room spinning since discharge.  He has not heard from anybody from vestibular physical therapy. -Ambulatory referral to vestibular rehab. -Gave patient number to call them if he has not heard from them in 1 week.  Potential for lack of continuity of care Patient has no PCP currently.  In effort to assist patient in locating a PCP was made during hospital admission by the social workers, but it does not appear anything is, this since discharge.  He would like to see somebody in [facility] or near his parents house. -We will forward to Neoma Laming our Education officer, museum for help with assisting in finding a PCP for patient.     Benay Pike, MD [place]

## 2020-01-14 NOTE — Assessment & Plan Note (Signed)
Patient states this has improved since his discharge.  He only had one episode while working Monday night where he felt like his body wanted to drift to the right.  That resolved.  He is not feeling the vertigo-like sensation of the room spinning since discharge.  He has not heard from anybody from vestibular physical therapy. -Ambulatory referral to vestibular rehab. -Gave patient number to call them if he has not heard from them in 1 week.

## 2020-01-14 NOTE — Assessment & Plan Note (Signed)
Taking atorvastatin 10 mg.  Advised patient to take it before bedtime.

## 2020-01-15 LAB — BASIC METABOLIC PANEL
BUN/Creatinine Ratio: 17 (ref 9–20)
BUN: 18 mg/dL (ref 6–24)
CO2: 26 mmol/L (ref 20–29)
Calcium: 10.6 mg/dL — ABNORMAL HIGH (ref 8.7–10.2)
Chloride: 97 mmol/L (ref 96–106)
Creatinine, Ser: 1.06 mg/dL (ref 0.76–1.27)
GFR calc Af Amer: 98 mL/min/{1.73_m2} (ref >59)
GFR calc non Af Amer: 85 mL/min/{1.73_m2} (ref >59)
Glucose: 100 mg/dL — ABNORMAL HIGH (ref 65–99)
Potassium: 4 mmol/L (ref 3.5–5.2)
Sodium: 138 mmol/L (ref 134–144)

## 2020-01-19 ENCOUNTER — Ambulatory Visit: Payer: Self-pay | Admitting: Licensed Clinical Social Worker

## 2020-01-19 NOTE — Chronic Care Management (AMB) (Signed)
   Social Work  Care Management Consultation  01/19/2020 Name: Anthony Haas MRN: [id number] DOB: 1976-02-06 Anthony Haas is a 44 y.o. year old male who sees Benay Pike, MD for primary care. LCSW was consulted via in-box message by Dr. Jeannine Kitten for information /resources to assistance patient with locating a PCP in the [facility]  Assessment:  Patient recently discharged from hospital and needs a PCP close to his home.   Intervention: Patient was not interviewed or contacted during this encounter.  LCSW collaborated with Dr. Jeannine Kitten .  Conducted brief assessment of needs and provided recommendations. Relevant information shared with provider. Provider will give patient phone number to call [phone] to patient with locating PCP.  Plan:  1. No further follow up required by LCSW at this time 2. If further intervention is needed the care management team is available to follow up after a formal CCM referral is placed  3. Please consult with patient prior to making referral   Review of patient status, including review of consultants reports, relevant laboratory and other test results, and collaboration with appropriate care team members and the patient's provider was performed as part of comprehensive patient evaluation and provision of chronic care management services.      Casimer Lanius, [place] / [place]   [phone] 9:46 AM

## 2020-01-28 ENCOUNTER — Ambulatory Visit: Payer: 59 | Admitting: Family Medicine

## 2020-01-28 ENCOUNTER — Other Ambulatory Visit: Payer: Self-pay | Admitting: Family Medicine

## 2020-01-30 ENCOUNTER — Other Ambulatory Visit: Payer: Self-pay | Admitting: Family Medicine

## 2020-01-30 MED ORDER — LOSARTAN POTASSIUM 50 MG PO TABS: 50.0000 mg | ORAL_TABLET | Freq: Every day | ORAL | 0 refills | Status: DC

## 2020-01-30 NOTE — Telephone Encounter (Signed)
Can we call this patient to make sure that he is taking 50 mg daily.  He initially was taking 2 of his 25 mg after we increased him on his last visit.  I want to make sure he is not taking 2 of the 50 mg tablets if he has them yet.  I have just filled a prescription for 90 50 mg tablets

## 2020-02-02 NOTE — Telephone Encounter (Signed)
LVm to call office to inform pt of below.Anthony Haas, CMA

## 2020-02-03 NOTE — Telephone Encounter (Signed)
2nd attempt to reach pt. LVM to call office to inform him of below.Anthon Harpole Zimmerman Rumple, CMA

## 2020-02-04 ENCOUNTER — Other Ambulatory Visit: Payer: Self-pay | Admitting: Family Medicine

## 2020-02-09 ENCOUNTER — Ambulatory Visit (INDEPENDENT_AMBULATORY_CARE_PROVIDER_SITE_OTHER): Payer: 59 | Admitting: Family Medicine

## 2020-02-09 ENCOUNTER — Encounter: Payer: Self-pay | Admitting: Family Medicine

## 2020-02-09 ENCOUNTER — Other Ambulatory Visit: Payer: Self-pay

## 2020-02-09 VITALS — BP 164/120 | HR 87 | Ht 65.0 in | Wt 234.4 lb

## 2020-02-09 DIAGNOSIS — Z9189 Other specified personal risk factors, not elsewhere classified: Secondary | ICD-10-CM

## 2020-02-09 DIAGNOSIS — I10 Essential (primary) hypertension: Secondary | ICD-10-CM | POA: Diagnosis not present

## 2020-02-09 DIAGNOSIS — E669 Obesity, unspecified: Secondary | ICD-10-CM | POA: Diagnosis not present

## 2020-02-09 MED ORDER — HYDROCHLOROTHIAZIDE 12.5 MG PO TABS: 12.5000 mg | ORAL_TABLET | Freq: Every day | ORAL | 0 refills | Status: DC

## 2020-02-09 MED ORDER — AMLODIPINE BESYLATE 5 MG PO TABS: 10.0000 mg | ORAL_TABLET | Freq: Every day | ORAL | 1 refills | Status: DC

## 2020-02-09 MED ORDER — LOSARTAN POTASSIUM 50 MG PO TABS: 100.0000 mg | ORAL_TABLET | Freq: Every day | ORAL | 1 refills | Status: DC

## 2020-02-09 MED ORDER — ATORVASTATIN CALCIUM 10 MG PO TABS: 10.0000 mg | ORAL_TABLET | Freq: Every day | ORAL | 0 refills | Status: DC

## 2020-02-09 NOTE — Telephone Encounter (Signed)
Pt has appointment with PCP today. Anthony Haas, CMA

## 2020-02-09 NOTE — Progress Notes (Signed)
    SUBJECTIVE:   CHIEF COMPLAINT / HPI:   HTN: patient currently taking his medications for bp as prescribed.  He denies missing any doses.  He needs a refill on his HCTZ.  Patient is open to increasing his BP medication doses today.    Weight loss: pt desires to lose weight.  He states it used to be really easy for him to lose 20 or more lbs when he wanted to.  We discussed limiting how often he eats out, avoiding sugary drinks, and avoiding foods with high sodium content.  He is open to talking to our nutritionist, Dr. Jenne Campus.   Patient is open to calling the number we provided for help in looking for a primary care provider closer to where he lives.    PERTINENT  PMH / PSH: HTN,   OBJECTIVE:   BP (!) 164/120   Pulse 87   Ht 5\' 5"  (1.651 m)   Wt 234 lb 6.4 oz (106.3 kg)   SpO2 98%   BMI 39.01 kg/m   Gen: alert. No acute distress. Obese male, appears stated age.  Cv: rrr. No murmurs.  Pulm: lctab. No wheezes or crackles.    ASSESSMENT/PLAN:   Essential hypertension - Increased amlodipine to 10mg  - increased losartan to 100mg   - refilled hctz - bmp  Obesity (BMI 30-39.9) Discussed weight loss strategies with pt. Stressed importance of not eating out, not drinking sugary drinks, and avoiding high salt foods (for htn).   - gave pt contact info for Dr. Jenne Campus.    Potential for lack of continuity of care Gave pt number [phone] to help with finding pcp near his home      Benay Pike, MD [place]

## 2020-02-09 NOTE — Patient Instructions (Addendum)
It was nice to see you again today,  Here is a number you can call for help with finding a primary care provider closer to where you live.  We can discuss this on your next visit in 3 to 4 weeks: [phone]  For your blood pressure we have made the following changes today: -I have increased your amlodipine from 5 mg to 10 mg.  For now I just want you to take 2 of your 5 mg tablets of amlodipine every day.  Once you get a new refill we will change it to the 10 mg tablets  -I have increased your losartan from 50 mg to 100 mg a day.  For now you can take 2 of your 50 mg tablets every day until this prescription runs out.  Starting with the next prescription we will give you 100 mg tablets and you will only take 1 of those per day.  I have refilled your HCTZ and your atorvastatin.  Our nutritionist, Iver Nestle, is available if you would like to talk about weight loss or diet.  Her number is [phone].  Her email is [email or web address].  have a great day,  Clemetine Marker, MD

## 2020-02-09 NOTE — Progress Notes (Deleted)
HYPERTENSION Home BP Monitoring readings: ***  Chest pain- ***     Medication Adherence  ***. Lightheadedness-  ***  Edema- ***

## 2020-02-10 LAB — BASIC METABOLIC PANEL
BUN/Creatinine Ratio: 15 (ref 9–20)
BUN: 14 mg/dL (ref 6–24)
CO2: 27 mmol/L (ref 20–29)
Calcium: 9.9 mg/dL (ref 8.7–10.2)
Chloride: 99 mmol/L (ref 96–106)
Creatinine, Ser: 0.92 mg/dL (ref 0.76–1.27)
GFR calc Af Amer: 117 mL/min/{1.73_m2} (ref >59)
GFR calc non Af Amer: 101 mL/min/{1.73_m2} (ref >59)
Glucose: 128 mg/dL — ABNORMAL HIGH (ref 65–99)
Potassium: 3.8 mmol/L (ref 3.5–5.2)
Sodium: 140 mmol/L (ref 134–144)

## 2020-02-11 DIAGNOSIS — E669 Obesity, unspecified: Secondary | ICD-10-CM | POA: Insufficient documentation

## 2020-02-11 NOTE — Assessment & Plan Note (Signed)
-   Increased amlodipine to 10mg  - increased losartan to 100mg   - refilled hctz - bmp

## 2020-02-11 NOTE — Assessment & Plan Note (Signed)
Gave pt number [phone] to help with finding pcp near his home

## 2020-02-11 NOTE — Assessment & Plan Note (Signed)
Discussed weight loss strategies with pt. Stressed importance of not eating out, not drinking sugary drinks, and avoiding high salt foods (for htn).   - gave pt contact info for Dr. Jenne Campus.

## 2020-03-01 ENCOUNTER — Other Ambulatory Visit: Payer: Self-pay | Admitting: Family Medicine

## 2020-03-08 ENCOUNTER — Ambulatory Visit: Payer: 59 | Admitting: Family Medicine

## 2020-03-08 NOTE — Progress Notes (Deleted)
    SUBJECTIVE:   CHIEF COMPLAINT / HPI:   HTN  PERTINENT  PMH / PSH: ***  OBJECTIVE:   There were no vitals taken for this visit.  ***  ASSESSMENT/PLAN:   No problem-specific Assessment & Plan notes found for this encounter.     Benay Pike, MD [place]

## 2020-04-05 ENCOUNTER — Encounter: Payer: Self-pay | Admitting: Family Medicine

## 2020-04-05 ENCOUNTER — Other Ambulatory Visit: Payer: Self-pay

## 2020-04-05 ENCOUNTER — Other Ambulatory Visit: Payer: Self-pay | Admitting: Family Medicine

## 2020-04-05 ENCOUNTER — Ambulatory Visit (INDEPENDENT_AMBULATORY_CARE_PROVIDER_SITE_OTHER): Payer: 59 | Admitting: Family Medicine

## 2020-04-05 DIAGNOSIS — E7800 Pure hypercholesterolemia, unspecified: Secondary | ICD-10-CM | POA: Diagnosis not present

## 2020-04-05 DIAGNOSIS — I10 Essential (primary) hypertension: Secondary | ICD-10-CM

## 2020-04-05 NOTE — Progress Notes (Signed)
    SUBJECTIVE:   CHIEF COMPLAINT / HPI:   HTN: Patient is currently on max dose of amlodipine, losartan hydrochlorothiazide.  He states he takes his medication every day and does not miss doses and takes his medication around dinnertime.  He feels well with no side effects such as swelling or cough.  Patient has had both Pfizer vaccinations, but forgot to bring in his card as proof.  He will try to bring it in next time.  He said he got them "sometime in the spring".   PERTINENT  PMH / PSH: Uncontrolled hypertension  OBJECTIVE:   BP 138/86   Pulse 80   Wt 234 lb (106.1 kg)   SpO2 98%   BMI 38.94 kg/m   General: Alert and oriented.  No acute distress. CV: Regular rate and rhythm, no murmurs. Pulmonary: Lungs clear auscultation bilaterally. Extremities: No lower extremity edema  ASSESSMENT/PLAN:   Essential hypertension Blood pressure 138/86 today.  He is on max dose of 3 medications.  Will not go up as he is under 140/90.  We will have him follow-up in 1 to 2 months to recheck.  If he no longer remains controlled, we should work-up for other causes of hypertension and then possibly start eplerenone  High cholesterol Patient not fasting today.  Advised patient to come in for a fasting lab sometime in the future.  We will put in lipid panel future order.  We will adjust his statin dose if necessary.     Benay Pike, MD [place]

## 2020-04-05 NOTE — Patient Instructions (Signed)
It was nice to see you again today,  Your blood pressure is doing better.  Week we will continue at the current dose.  I would like to see you back in 3 months.  I would like to check your cholesterol levels again.  But I would like to check them while they are fasting so I will put in an order for a future blood test and you can come in at your earliest convenience and have it done.  Just make sure that you have not had anything to eat since waking up that day.  Please follow back up in 3 months.  Have a great day,  Clemetine Marker, MD

## 2020-04-06 NOTE — Assessment & Plan Note (Signed)
Blood pressure 138/86 today.  He is on max dose of 3 medications.  Will not go up as he is under 140/90.  We will have him follow-up in 1 to 2 months to recheck.  If he no longer remains controlled, we should work-up for other causes of hypertension and then possibly start eplerenone

## 2020-04-06 NOTE — Assessment & Plan Note (Signed)
Patient not fasting today.  Advised patient to come in for a fasting lab sometime in the future.  We will put in lipid panel future order.  We will adjust his statin dose if necessary.

## 2020-04-16 ENCOUNTER — Other Ambulatory Visit: Payer: Self-pay | Admitting: Family Medicine

## 2020-04-19 NOTE — Telephone Encounter (Signed)
Can we call him and ask him to come in to get his cholesterol measured? I believe he was supposed to have it done last time but it doesn't appear it's been done.

## 2020-04-27 ENCOUNTER — Other Ambulatory Visit: Payer: Self-pay

## 2020-04-27 ENCOUNTER — Telehealth: Payer: Self-pay | Admitting: Family Medicine

## 2020-04-27 NOTE — Telephone Encounter (Signed)
Pt walked in requesting medication refill of amLoDipine 5mg , Hydrochlorothiazide 12.5 mg, and Losartan 50 mg.  Want prescription sent to [facility] at  [place]. Ph [phone]

## 2020-04-28 MED ORDER — HYDROCHLOROTHIAZIDE 12.5 MG PO TABS: 12.5000 mg | ORAL_TABLET | Freq: Every day | ORAL | 1 refills | Status: DC

## 2020-04-28 MED ORDER — LOSARTAN POTASSIUM 50 MG PO TABS: 100.0000 mg | ORAL_TABLET | Freq: Every day | ORAL | 1 refills | Status: DC

## 2020-04-28 MED ORDER — AMLODIPINE BESYLATE 5 MG PO TABS: 10.0000 mg | ORAL_TABLET | Freq: Every day | ORAL | 1 refills | Status: AC

## 2020-05-10 NOTE — Telephone Encounter (Signed)
LVM to have him come in to get his cholesterol checked. Did not mention pt name as there is no mention of who's VM it is. Office number left on VM so pt can call and schedule an appointment. Ottis Stain, CMA

## 2020-05-19 NOTE — Telephone Encounter (Signed)
LVM for a return call to schedule an appt to have his cholesterol checked. If pt calls, please help him schedule a lab visit for first appt of the day. This needs to be a fasting lab. Pt can only have a sip of water to take medications in the am he comes in. Ottis Stain, CMA

## 2020-05-19 NOTE — Telephone Encounter (Signed)
Letter put in out going mail for pt to call and schedule a lab visit to have his cholesterol checked. Ottis Stain, CMA

## 2020-07-23 ENCOUNTER — Other Ambulatory Visit: Payer: Self-pay | Admitting: Family Medicine

## 2021-01-02 ENCOUNTER — Ambulatory Visit: Payer: 59 | Admitting: Family Medicine

## 2021-01-03 ENCOUNTER — Encounter: Payer: Self-pay | Admitting: Family Medicine

## 2021-01-03 ENCOUNTER — Other Ambulatory Visit: Payer: Self-pay

## 2021-01-03 ENCOUNTER — Ambulatory Visit: Payer: Self-pay

## 2021-01-03 ENCOUNTER — Ambulatory Visit (INDEPENDENT_AMBULATORY_CARE_PROVIDER_SITE_OTHER): Payer: 59 | Admitting: Family Medicine

## 2021-01-03 VITALS — BP 174/110 | Ht 66.0 in | Wt 240.0 lb

## 2021-01-03 DIAGNOSIS — M19012 Primary osteoarthritis, left shoulder: Secondary | ICD-10-CM | POA: Diagnosis not present

## 2021-01-03 DIAGNOSIS — M25512 Pain in left shoulder: Secondary | ICD-10-CM

## 2021-01-03 IMAGING — MR MR HEAD W/ CM
3 of 4 series · 27 of 48 positions shown · IV contrast (gadavist)
Comparison: Noncontrast study performed 01/04/2020

CLINICAL DATA: Intracranial mass, follow-up

EXAM:
MRI HEAD WITH CONTRAST
TECHNIQUE: Multiplanar, multiecho pulse sequences of the brain and surrounding
structures were obtained with intravenous contrast.
CONTRAST:  9mL GADAVIST GADOBUTROL 1 MMOL/ML IV SOLN

[Series 5: T2 post-contrast · coronal · 5.0mm · 0.72mm/px · 9 of 32 slices shown]
[im 1/32]
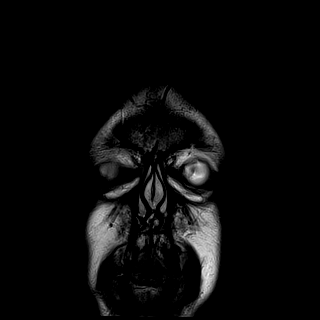
[im 4/32]
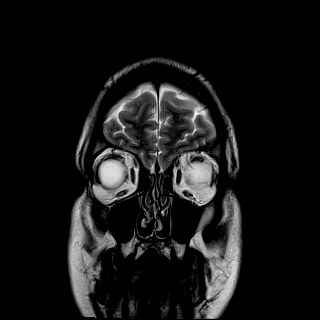
[im 7/32]
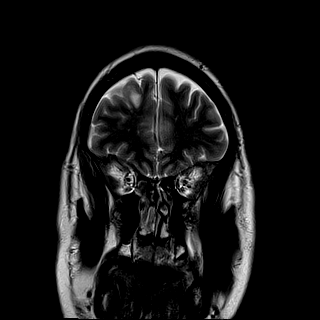
[im 11/32]
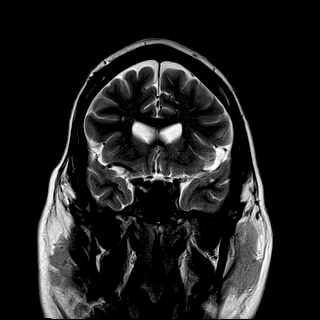
[im 14/32]
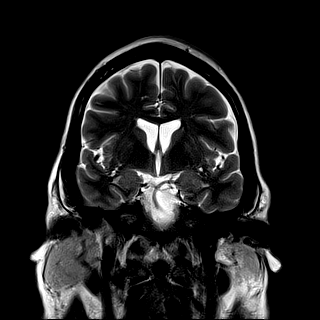
[im 18/32]
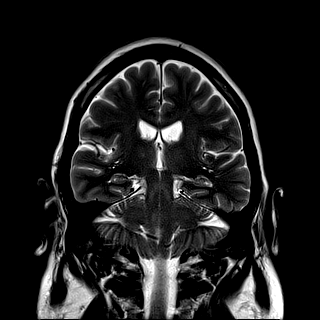
[im 21/32]
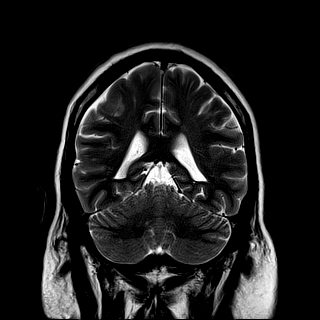
[im 28/32]
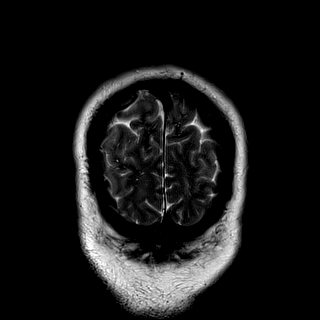
[im 32/32]
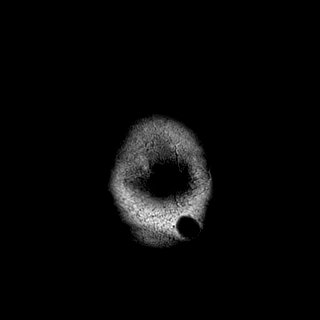

[Series 7: T1 post-contrast · coronal · 5.0mm · 0.34mm/px · 10 of 32 slices shown (1 of 2)]
[im 1/32]
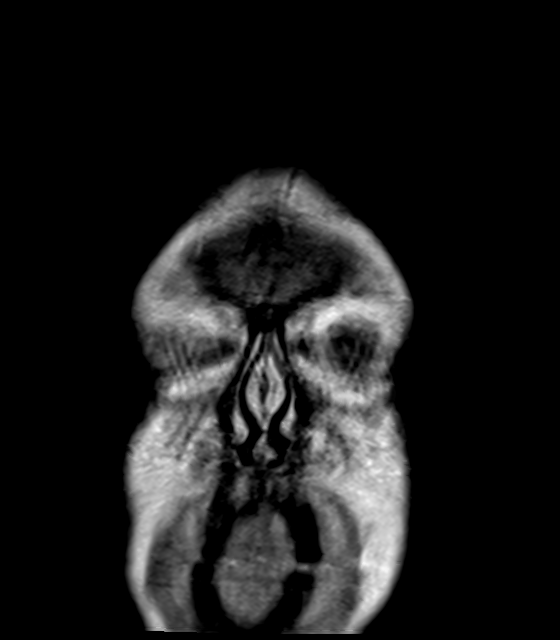
[im 4/32]
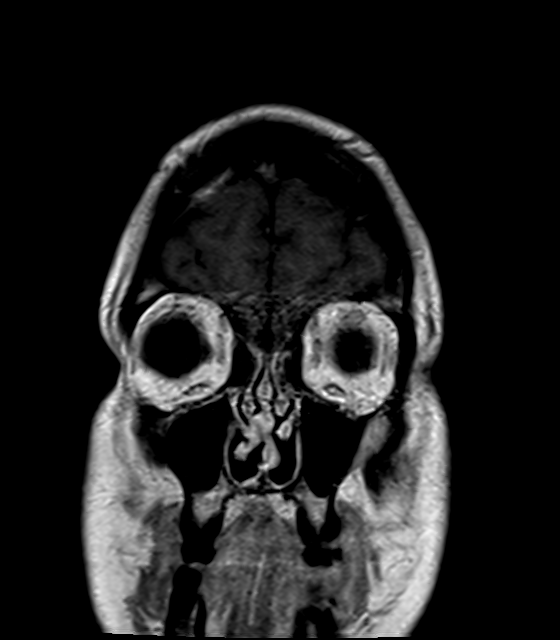
[im 7/32]
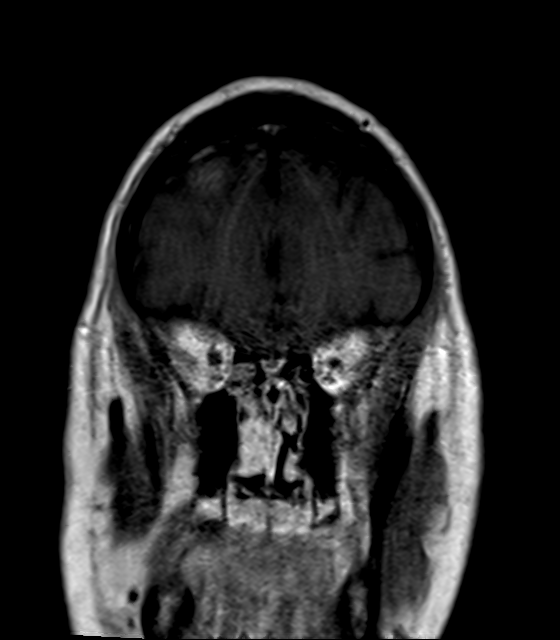
[im 11/32]
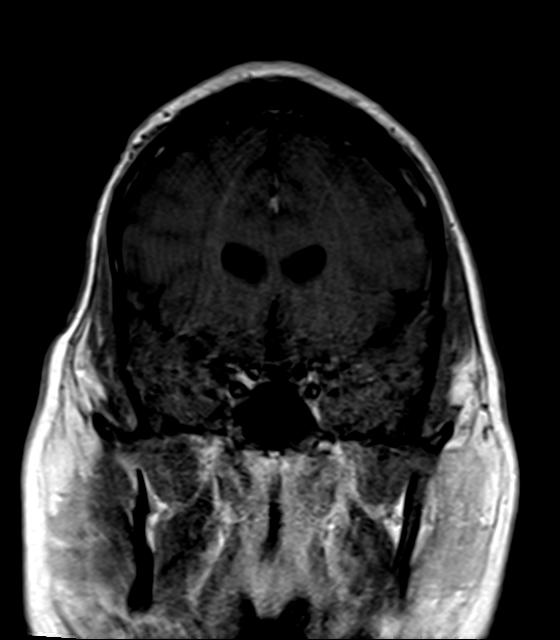
[im 14/32]
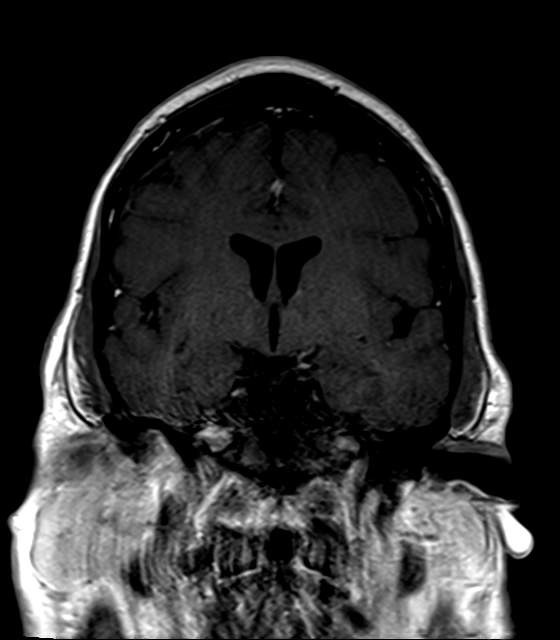
[im 18/32]
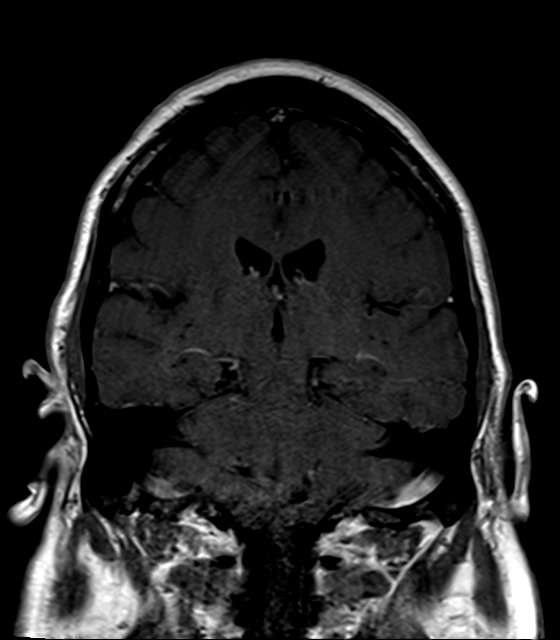
[im 21/32]
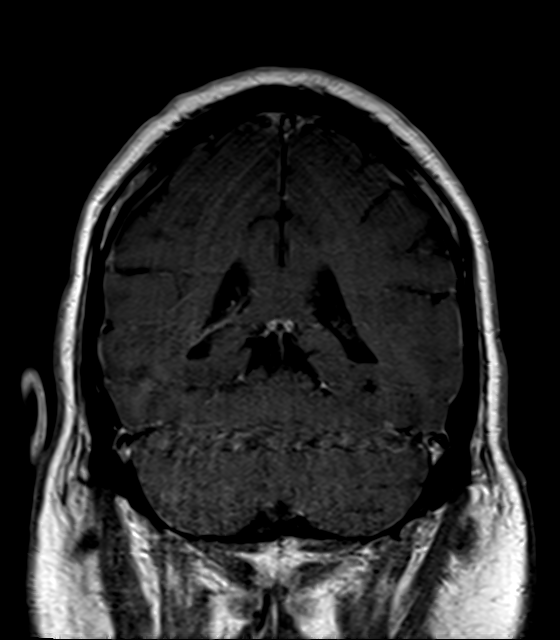
[im 25/32]
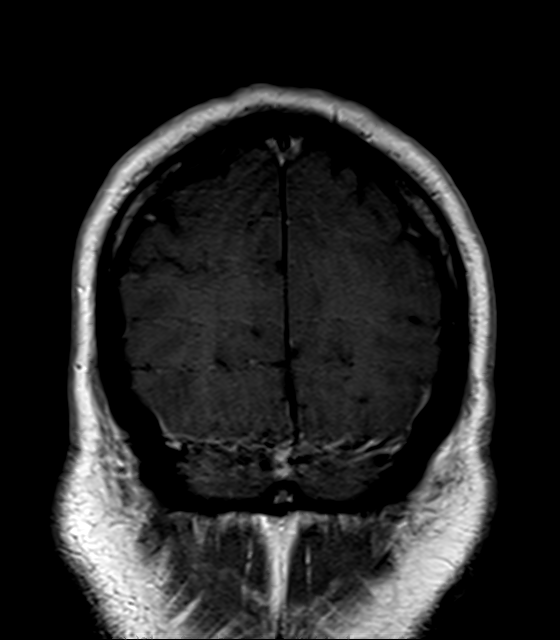
[im 28/32]
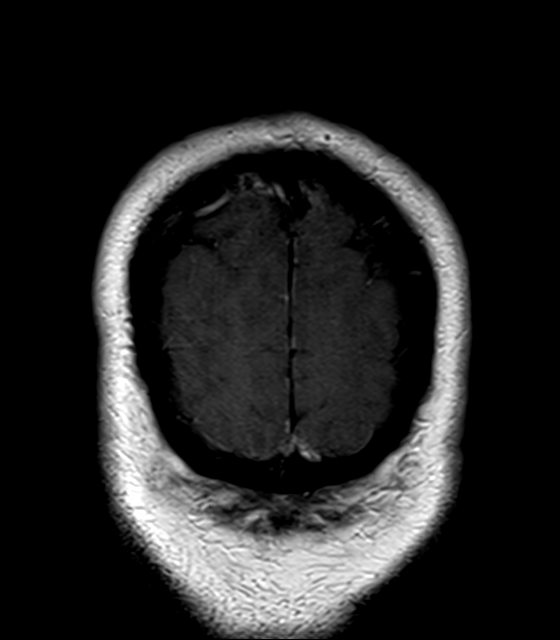
[im 32/32]
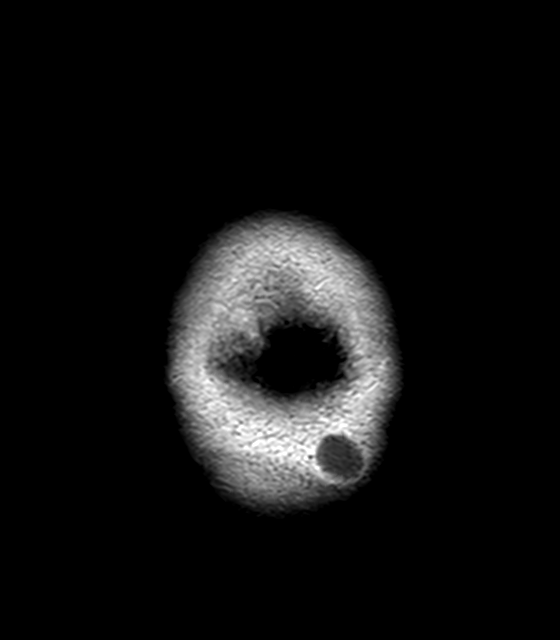

[Series 8: T1 post-contrast · sagittal · 5.0mm · 0.78mm/px · 8 of 23 slices shown (2 of 2)]
[im 1/23]
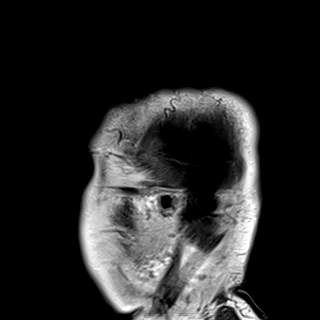
[im 4/23]
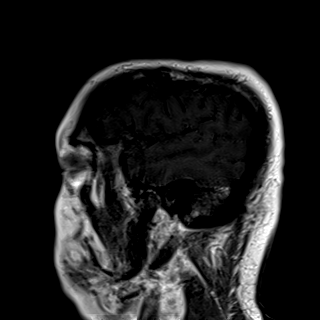
[im 7/23]
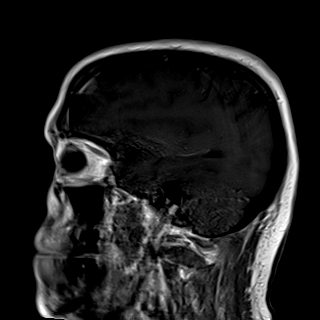
[im 10/23]
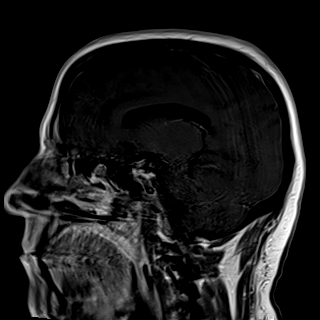
[im 13/23]
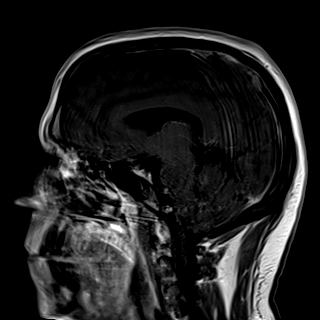
[im 16/23]
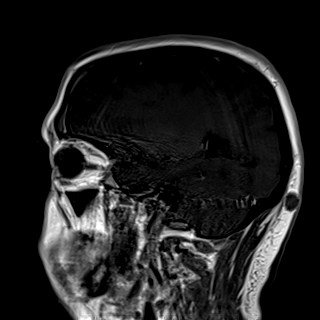
[im 19/23]
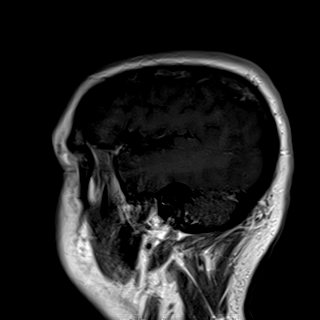
[im 23/23]
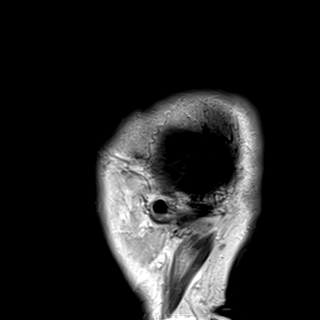

[27 of 48 positions shown; findings below may reference images not displayed]

FINDINGS: Motion artifact is present.

Brain: Approximately 1.4 x 1.9 x 1.5 cm dural-based lesion along the
right frontal convexity demonstrates enhancement and remains
consistent with a meningioma. There is mild mass effect on the
underlying parenchyma. Mild underlying parenchymal edema is also
present.

Vascular: Unremarkable.

Skull and upper cervical spine: No new finding.

Sinuses/Orbits: No new finding.

Other: None.
IMPRESSION: Postcontrast imaging again demonstrates right frontal convexity
meningioma. There is mild underlying parenchymal edema.

## 2021-01-03 NOTE — Assessment & Plan Note (Signed)
Symptoms seem most consistent with degenerative changes of the joint.  Has limited range of motion but intermittent and limited pain. -Counseled on home exercise therapy and supportive care. -Could consider physical therap or injection.

## 2021-01-03 NOTE — Progress Notes (Signed)
  Anthony Haas - 45 y.o. male MRN [id number]  Date of birth: September 15, 1975  SUBJECTIVE:  Including CC & ROS.  No chief complaint on file.   Anthony Haas is a 45 y.o. male that is presenting with acute on chronic left shoulder pain.  Pain is localized to the left shoulder.  No history of trauma or inciting event.  Is intermittent in nature.  Review of the left shoulder x-ray from 5/16 shows moderate osteoarthritic changes of the glenohumeral joint   Review of Systems See HPI   HISTORY: Past Medical, Surgical, Social, and Family History Reviewed & Updated per EMR.   Pertinent Historical Findings include:  Past Medical History:  Diagnosis Date  . Autism   . High cholesterol   . Sleep apnea     Past Surgical History:  Procedure Laterality Date  . TONSILLECTOMY      History reviewed. No pertinent family history.  Social History   Socioeconomic History  . Marital status: Single    Spouse name: Not on file  . Number of children: Not on file  . Years of education: Not on file  . Highest education level: Not on file  Occupational History  . Not on file  Tobacco Use  . Smoking status: Never Smoker  . Smokeless tobacco: Never Used  Vaping Use  . Vaping Use: Never used  Substance and Sexual Activity  . Alcohol use: Never  . Drug use: Never  . Sexual activity: Never  Other Topics Concern  . Not on file  Social History Narrative  . Not on file   Social Determinants of Health   Financial Resource Strain: Not on file  Food Insecurity: Not on file  Transportation Needs: Not on file  Physical Activity: Not on file  Stress: Not on file  Social Connections: Not on file  Intimate Partner Violence: Not on file     PHYSICAL EXAM:  VS: BP (!) 174/110 (BP Location: Right Arm, Patient Position: Sitting, Cuff Size: Large)   Ht 5\' 6"  (1.676 m)   Wt 240 lb (108.9 kg)   BMI 38.74 kg/m  Physical Exam Gen: NAD, alert, cooperative with exam, well-appearing MSK:  Left  shoulder: Limited external rotation and abduction. Limited internal rotation. Normal strength resistance. Neurovascular intact  Limited ultrasound: Left shoulder:  Encircling effusion of the biceps tendon. No acute changes of the subscapularis or supraspinatus. Limited external rotation on dynamic testing of the posterior glenohumeral joint and mild effusion.  Summary: Degenerative changes of the joint.  Ultrasound and interpretation by Clearance Coots, MD    ASSESSMENT & PLAN:   Primary osteoarthritis of left shoulder Symptoms seem most consistent with degenerative changes of the joint.  Has limited range of motion but intermittent and limited pain. -Counseled on home exercise therapy and supportive care. -Could consider physical therap or injection.

## 2021-01-03 NOTE — Patient Instructions (Signed)
Nice to meet you Please try the exercises  Please use heat before exercises and ice after  Please try volatren if needed Please send me a message in MyChart with any questions or updates.  Please see me back in 4 weeks.   --Dr. Raeford Razor

## 2021-02-05 ENCOUNTER — Other Ambulatory Visit: Payer: Self-pay

## 2021-02-05 ENCOUNTER — Ambulatory Visit (INDEPENDENT_AMBULATORY_CARE_PROVIDER_SITE_OTHER): Payer: 59 | Admitting: Family Medicine

## 2021-02-05 ENCOUNTER — Encounter: Payer: Self-pay | Admitting: Family Medicine

## 2021-02-05 VITALS — BP 130/72 | Ht 66.0 in | Wt 240.0 lb

## 2021-02-05 DIAGNOSIS — M19012 Primary osteoarthritis, left shoulder: Secondary | ICD-10-CM

## 2021-02-05 NOTE — Assessment & Plan Note (Signed)
Having improvement of his pain.  Still having limitation in his range of motion. -Counseled on home exercise therapy and supportive care. -Referral to physical therapy. -Could consider injection or imaging.

## 2021-02-05 NOTE — Progress Notes (Signed)
  Anthony Haas - 45 y.o. male MRN [id number]  Date of birth: 1976/06/14  SUBJECTIVE:  Including CC & ROS.  No chief complaint on file.   Anthony Haas is a 45 y.o. male that is following up for his left shoulder pain.  He has improvement of his pain but his range of motion is still limited.   Review of Systems See HPI   HISTORY: Past Medical, Surgical, Social, and Family History Reviewed & Updated per EMR.   Pertinent Historical Findings include:  Past Medical History:  Diagnosis Date   Autism    High cholesterol    Sleep apnea     Past Surgical History:  Procedure Laterality Date   TONSILLECTOMY      History reviewed. No pertinent family history.  Social History   Socioeconomic History   Marital status: Single    Spouse name: Not on file   Number of children: Not on file   Years of education: Not on file   Highest education level: Not on file  Occupational History   Not on file  Tobacco Use   Smoking status: Never   Smokeless tobacco: Never  Vaping Use   Vaping Use: Never used  Substance and Sexual Activity   Alcohol use: Never   Drug use: Never   Sexual activity: Never  Other Topics Concern   Not on file  Social History Narrative   Not on file   Social Determinants of Health   Financial Resource Strain: Not on file  Food Insecurity: Not on file  Transportation Needs: Not on file  Physical Activity: Not on file  Stress: Not on file  Social Connections: Not on file  Intimate Partner Violence: Not on file     PHYSICAL EXAM:  VS: BP 130/72 (BP Location: Right Arm, Patient Position: Sitting, Cuff Size: Large)   Ht 5\' 6"  (1.676 m)   Wt 240 lb (108.9 kg)   BMI 38.74 kg/m  Physical Exam Gen: NAD, alert, cooperative with exam, well-appearing MSK:  Left shoulder: Limited external rotation. Normal strength. Neurovascularly intact     ASSESSMENT & PLAN:   Primary osteoarthritis of left shoulder Having improvement of his pain.  Still having  limitation in his range of motion. -Counseled on home exercise therapy and supportive care. -Referral to physical therapy. -Could consider injection or imaging.

## 2021-02-05 NOTE — Patient Instructions (Signed)
Good to see you Please try heat before exercise and ice after  Please try physical therapy   Please send me a message in MyChart with any questions or updates.  Please see me back in 6 weeks.   --Dr. Raeford Razor

## 2021-03-19 ENCOUNTER — Ambulatory Visit: Payer: 59 | Admitting: Family Medicine

## 2021-03-19 NOTE — Progress Notes (Deleted)
  Anthony Haas - 45 y.o. male MRN [id number]  Date of birth: 1976-01-11  SUBJECTIVE:  Including CC & ROS.  No chief complaint on file.   Anthony Haas is a 45 y.o. male that is  ***.  ***   Review of Systems See HPI   HISTORY: Past Medical, Surgical, Social, and Family History Reviewed & Updated per EMR.   Pertinent Historical Findings include:  Past Medical History:  Diagnosis Date   Autism    High cholesterol    Sleep apnea     Past Surgical History:  Procedure Laterality Date   TONSILLECTOMY      No family history on file.  Social History   Socioeconomic History   Marital status: Single    Spouse name: Not on file   Number of children: Not on file   Years of education: Not on file   Highest education level: Not on file  Occupational History   Not on file  Tobacco Use   Smoking status: Never   Smokeless tobacco: Never  Vaping Use   Vaping Use: Never used  Substance and Sexual Activity   Alcohol use: Never   Drug use: Never   Sexual activity: Never  Other Topics Concern   Not on file  Social History Narrative   Not on file   Social Determinants of Health   Financial Resource Strain: Not on file  Food Insecurity: Not on file  Transportation Needs: Not on file  Physical Activity: Not on file  Stress: Not on file  Social Connections: Not on file  Intimate Partner Violence: Not on file     PHYSICAL EXAM:  VS: There were no vitals taken for this visit. Physical Exam Gen: NAD, alert, cooperative with exam, well-appearing MSK:  ***      ASSESSMENT & PLAN:   No problem-specific Assessment & Plan notes found for this encounter.

## 2021-03-20 ENCOUNTER — Encounter: Payer: Self-pay | Admitting: Family Medicine

## 2021-03-20 ENCOUNTER — Ambulatory Visit (INDEPENDENT_AMBULATORY_CARE_PROVIDER_SITE_OTHER): Payer: 59 | Admitting: Family Medicine

## 2021-03-20 ENCOUNTER — Other Ambulatory Visit: Payer: Self-pay

## 2021-03-20 VITALS — BP 130/82 | Ht 66.0 in | Wt 240.0 lb

## 2021-03-20 DIAGNOSIS — M5416 Radiculopathy, lumbar region: Secondary | ICD-10-CM | POA: Diagnosis not present

## 2021-03-20 DIAGNOSIS — M19012 Primary osteoarthritis, left shoulder: Secondary | ICD-10-CM | POA: Diagnosis not present

## 2021-03-20 NOTE — Assessment & Plan Note (Signed)
Doing well and pain has improved. -Counseled on home exercise therapy and supportive care. -Could consider injection or physical therapy.

## 2021-03-20 NOTE — Assessment & Plan Note (Signed)
Symptoms have been occurring since starting his new job a few weeks ago.  Most likely related to the number of steps that he takes per day. -Counseled on home exercise therapy and supportive care. -Could consider prednisone physical therapy.

## 2021-03-20 NOTE — Progress Notes (Signed)
  Anthony Haas - 45 y.o. male MRN [id number]  Date of birth: 1975-11-19  SUBJECTIVE:  Including CC & ROS.  No chief complaint on file.   Anthony Haas is a 45 y.o. male that is following up for his left shoulder pain.  He denies any pain today.  He has been feeling well.  Having some radicular pain down his leg.  He is walking about 14,000 steps per day.    Review of Systems See HPI   HISTORY: Past Medical, Surgical, Social, and Family History Reviewed & Updated per EMR.   Pertinent Historical Findings include:  Past Medical History:  Diagnosis Date   Autism    High cholesterol    Sleep apnea     Past Surgical History:  Procedure Laterality Date   TONSILLECTOMY      History reviewed. No pertinent family history.  Social History   Socioeconomic History   Marital status: Single    Spouse name: Not on file   Number of children: Not on file   Years of education: Not on file   Highest education level: Not on file  Occupational History   Not on file  Tobacco Use   Smoking status: Never   Smokeless tobacco: Never  Vaping Use   Vaping Use: Never used  Substance and Sexual Activity   Alcohol use: Never   Drug use: Never   Sexual activity: Never  Other Topics Concern   Not on file  Social History Narrative   Not on file   Social Determinants of Health   Financial Resource Strain: Not on file  Food Insecurity: Not on file  Transportation Needs: Not on file  Physical Activity: Not on file  Stress: Not on file  Social Connections: Not on file  Intimate Partner Violence: Not on file     PHYSICAL EXAM:  VS: BP 130/82 (BP Location: Left Arm, Patient Position: Sitting, Cuff Size: Large)   Ht '5\' 6"'$  (1.676 m)   Wt 240 lb (108.9 kg)   BMI 38.74 kg/m  Physical Exam Gen: NAD, alert, cooperative with exam, well-appearing      ASSESSMENT & PLAN:   Primary osteoarthritis of left shoulder Doing well and pain has improved. -Counseled on home exercise therapy and  supportive care. -Could consider injection or physical therapy.  Lumbar radiculopathy Symptoms have been occurring since starting his new job a few weeks ago.  Most likely related to the number of steps that he takes per day. -Counseled on home exercise therapy and supportive care. -Could consider prednisone physical therapy.

## 2022-11-26 ENCOUNTER — Encounter: Payer: Self-pay | Admitting: *Deleted
